# Patient Record
Sex: Male | Born: 1940 | Race: White | Hispanic: No | Marital: Single | Smoking: Never smoker
Health system: Southern US, Community
[De-identification: ages and names within clinical notes are randomized; demographics above are authoritative.]

## PROBLEM LIST (undated history)

## (undated) DIAGNOSIS — C20 Malignant neoplasm of rectum: Secondary | ICD-10-CM

## (undated) DIAGNOSIS — E119 Type 2 diabetes mellitus without complications: Secondary | ICD-10-CM

## (undated) DIAGNOSIS — I10 Essential (primary) hypertension: Secondary | ICD-10-CM

## (undated) HISTORY — DX: Malignant neoplasm of rectum: C20

## (undated) HISTORY — DX: Type 2 diabetes mellitus without complications: E11.9

## (undated) HISTORY — DX: Essential (primary) hypertension: I10

---

## 2005-12-18 ENCOUNTER — Other Ambulatory Visit: Payer: Self-pay

## 2005-12-18 ENCOUNTER — Emergency Department: Payer: Self-pay | Admitting: Emergency Medicine

## 2006-02-23 ENCOUNTER — Other Ambulatory Visit: Payer: Self-pay

## 2006-02-23 ENCOUNTER — Inpatient Hospital Stay: Payer: Self-pay | Admitting: Internal Medicine

## 2006-09-07 ENCOUNTER — Emergency Department: Payer: Self-pay | Admitting: Emergency Medicine

## 2006-09-09 ENCOUNTER — Emergency Department: Payer: Self-pay | Admitting: General Practice

## 2007-09-28 ENCOUNTER — Emergency Department: Payer: Self-pay | Admitting: Emergency Medicine

## 2007-11-22 ENCOUNTER — Emergency Department: Payer: Self-pay | Admitting: Emergency Medicine

## 2007-11-22 ENCOUNTER — Other Ambulatory Visit: Payer: Self-pay

## 2008-02-29 ENCOUNTER — Emergency Department: Payer: Self-pay | Admitting: Emergency Medicine

## 2008-07-18 ENCOUNTER — Emergency Department: Payer: Self-pay | Admitting: Emergency Medicine

## 2008-10-22 ENCOUNTER — Emergency Department: Payer: Self-pay | Admitting: Emergency Medicine

## 2009-01-24 ENCOUNTER — Emergency Department: Payer: Self-pay

## 2009-01-25 ENCOUNTER — Observation Stay: Payer: Self-pay | Admitting: Internal Medicine

## 2009-06-12 ENCOUNTER — Emergency Department: Payer: Self-pay | Admitting: Emergency Medicine

## 2009-12-23 ENCOUNTER — Emergency Department: Payer: Self-pay | Admitting: Unknown Physician Specialty

## 2010-04-23 ENCOUNTER — Observation Stay: Payer: Self-pay | Admitting: Internal Medicine

## 2011-09-03 ENCOUNTER — Emergency Department: Payer: Self-pay | Admitting: Emergency Medicine

## 2011-09-03 LAB — COMPREHENSIVE METABOLIC PANEL
BUN: 9 mg/dL (ref 7–18)
Bilirubin,Total: 0.3 mg/dL (ref 0.2–1.0)
Calcium, Total: 8.9 mg/dL (ref 8.5–10.1)
Osmolality: 281 (ref 275–301)
SGOT(AST): 18 U/L (ref 15–37)
SGPT (ALT): 19 U/L
Total Protein: 7.6 g/dL (ref 6.4–8.2)

## 2011-09-03 LAB — CBC
MCHC: 31.9 g/dL — ABNORMAL LOW (ref 32.0–36.0)
MCV: 87 fL (ref 80–100)
Platelet: 308 10*3/uL (ref 150–440)

## 2012-05-20 DIAGNOSIS — C20 Malignant neoplasm of rectum: Secondary | ICD-10-CM

## 2012-05-20 HISTORY — DX: Malignant neoplasm of rectum: C20

## 2012-06-11 LAB — CBC AND DIFFERENTIAL: Hemoglobin: 10.4 g/dL — AB (ref 13.5–17.5)

## 2012-06-18 LAB — HEPATIC FUNCTION PANEL: Alkaline Phosphatase: 77 U/L (ref 25–125)

## 2012-06-18 LAB — BASIC METABOLIC PANEL
BUN: 14 mg/dL (ref 4–21)
Glucose: 121 mg/dL
Potassium: 5.3 mmol/L (ref 3.4–5.3)
Sodium: 8 mmol/L — AB (ref 137–147)

## 2012-06-25 ENCOUNTER — Ambulatory Visit: Payer: Self-pay | Admitting: General Surgery

## 2012-06-27 ENCOUNTER — Encounter: Payer: Self-pay | Admitting: General Surgery

## 2012-06-27 DIAGNOSIS — E119 Type 2 diabetes mellitus without complications: Secondary | ICD-10-CM | POA: Insufficient documentation

## 2012-06-27 DIAGNOSIS — I10 Essential (primary) hypertension: Secondary | ICD-10-CM | POA: Insufficient documentation

## 2012-06-27 DIAGNOSIS — C20 Malignant neoplasm of rectum: Secondary | ICD-10-CM

## 2012-07-09 ENCOUNTER — Ambulatory Visit: Payer: Self-pay | Admitting: Radiation Oncology

## 2012-07-18 ENCOUNTER — Ambulatory Visit: Payer: Self-pay | Admitting: Radiation Oncology

## 2012-07-20 ENCOUNTER — Ambulatory Visit: Payer: Self-pay | Admitting: General Surgery

## 2012-07-23 ENCOUNTER — Ambulatory Visit: Payer: Self-pay | Admitting: General Surgery

## 2012-07-28 LAB — COMPREHENSIVE METABOLIC PANEL
Albumin: 3.1 g/dL — ABNORMAL LOW (ref 3.4–5.0)
Alkaline Phosphatase: 79 U/L (ref 50–136)
BUN: 15 mg/dL (ref 7–18)
Bilirubin,Total: 0.3 mg/dL (ref 0.2–1.0)
Calcium, Total: 8.7 mg/dL (ref 8.5–10.1)
Creatinine: 0.9 mg/dL (ref 0.60–1.30)
Glucose: 155 mg/dL — ABNORMAL HIGH (ref 65–99)
Osmolality: 280 (ref 275–301)
SGOT(AST): 15 U/L (ref 15–37)
SGPT (ALT): 23 U/L (ref 12–78)
Sodium: 138 mmol/L (ref 136–145)
Total Protein: 7.1 g/dL (ref 6.4–8.2)

## 2012-07-28 LAB — CBC CANCER CENTER
Basophil %: 0.8 %
Eosinophil #: 0.2 x10 3/mm (ref 0.0–0.7)
HGB: 10 g/dL — ABNORMAL LOW (ref 13.0–18.0)
Lymphocyte #: 1.6 x10 3/mm (ref 1.0–3.6)
Lymphocyte %: 26.5 %
MCHC: 31.7 g/dL — ABNORMAL LOW (ref 32.0–36.0)
MCV: 76 fL — ABNORMAL LOW (ref 80–100)
Monocyte #: 0.4 x10 3/mm (ref 0.2–1.0)
Monocyte %: 6.9 %
Neutrophil #: 3.8 x10 3/mm (ref 1.4–6.5)
RBC: 4.16 10*6/uL — ABNORMAL LOW (ref 4.40–5.90)

## 2012-08-04 LAB — CBC CANCER CENTER
Basophil #: 0 x10 3/mm (ref 0.0–0.1)
Eosinophil #: 0.3 x10 3/mm (ref 0.0–0.7)
HCT: 32.1 % — ABNORMAL LOW (ref 40.0–52.0)
HGB: 10.3 g/dL — ABNORMAL LOW (ref 13.0–18.0)
Lymphocyte #: 1 x10 3/mm (ref 1.0–3.6)
MCH: 24.6 pg — ABNORMAL LOW (ref 26.0–34.0)
MCV: 76 fL — ABNORMAL LOW (ref 80–100)
Monocyte #: 0.2 x10 3/mm (ref 0.2–1.0)
Monocyte %: 6.4 %
Neutrophil #: 2.3 x10 3/mm (ref 1.4–6.5)
RBC: 4.2 10*6/uL — ABNORMAL LOW (ref 4.40–5.90)

## 2012-08-18 ENCOUNTER — Ambulatory Visit: Payer: Self-pay | Admitting: Radiation Oncology

## 2012-08-18 LAB — CBC CANCER CENTER
Eosinophil #: 0.4 x10 3/mm (ref 0.0–0.7)
HCT: 30 % — ABNORMAL LOW (ref 40.0–52.0)
HGB: 9.5 g/dL — ABNORMAL LOW (ref 13.0–18.0)
Lymphocyte #: 0.6 x10 3/mm — ABNORMAL LOW (ref 1.0–3.6)
MCH: 24.5 pg — ABNORMAL LOW (ref 26.0–34.0)
Monocyte %: 13.2 %
Neutrophil #: 1.7 x10 3/mm (ref 1.4–6.5)
Neutrophil %: 56.2 %
Platelet: 213 x10 3/mm (ref 150–440)
RBC: 3.88 10*6/uL — ABNORMAL LOW (ref 4.40–5.90)
RDW: 18.8 % — ABNORMAL HIGH (ref 11.5–14.5)
WBC: 3.1 x10 3/mm — ABNORMAL LOW (ref 3.8–10.6)

## 2012-09-17 ENCOUNTER — Ambulatory Visit: Payer: Self-pay | Admitting: Radiation Oncology

## 2012-09-17 ENCOUNTER — Ambulatory Visit: Payer: Self-pay | Admitting: Oncology

## 2012-10-15 ENCOUNTER — Ambulatory Visit: Payer: Medicare Other | Admitting: General Surgery

## 2012-10-18 ENCOUNTER — Ambulatory Visit: Payer: Self-pay | Admitting: Radiation Oncology

## 2012-10-19 ENCOUNTER — Ambulatory Visit (INDEPENDENT_AMBULATORY_CARE_PROVIDER_SITE_OTHER): Payer: Medicare Other | Admitting: General Surgery

## 2012-10-19 ENCOUNTER — Encounter: Payer: Self-pay | Admitting: General Surgery

## 2012-10-19 VITALS — BP 140/78 | HR 84 | Resp 16 | Ht 64.0 in | Wt 192.0 lb

## 2012-10-19 DIAGNOSIS — I8390 Asymptomatic varicose veins of unspecified lower extremity: Secondary | ICD-10-CM

## 2012-10-19 DIAGNOSIS — C20 Malignant neoplasm of rectum: Secondary | ICD-10-CM

## 2012-10-19 DIAGNOSIS — I8391 Asymptomatic varicose veins of right lower extremity: Secondary | ICD-10-CM

## 2012-10-19 NOTE — Progress Notes (Addendum)
Patient ID: John Shaw, male   DOB: April 15, 1941, 72 y.o.   MRN: [id number]  Chief Complaint  Patient presents with  . Follow-up    evaluation rectal cancer    HPI John Shaw is a 72 y.o. male who presents for a follow up evaluation for rectal cancer. The patient was initially evaluated in January/February 2014. He was felt to have advanced cancer based on CT imaging showed a large perirectal mass and an elevated CEA. He has completed neoadjuvant chemotherapy. The patient is seen now accompanied by his cousin to discuss options and indications for surgery. HPI  Past Medical History  Diagnosis Date  . Diabetes mellitus without complication   . Hypertension   . Rectal cancer 2014    Past Surgical History  Procedure Laterality Date  . No past surgeries      No family history on file.  Social History History  Substance Use Topics  . Smoking status: Never Smoker   . Smokeless tobacco: Not on file  . Alcohol Use: No    No Known Allergies  Current Outpatient Prescriptions  Medication Sig Dispense Refill  . amLODipine (NORVASC) 10 MG tablet Take 5 mg by mouth daily.       Marland Kitchen atenolol (TENORMIN) 25 MG tablet Take 25 mg by mouth daily.      . hydrocortisone (ANUSOL-HC) 25 MG suppository Place 25 mg rectally as needed.       Marland Kitchen lisinopril (PRINIVIL,ZESTRIL) 5 MG tablet Take 5 mg by mouth daily.      . metFORMIN (GLUMETZA) 500 MG (MOD) 24 hr tablet Take 1,000 mg by mouth daily.       . ondansetron (ZOFRAN) 4 MG tablet Take 4 mg by mouth every 6 (six) hours as needed. Nausea/vomiting      . pramoxine-hydrocortisone 1-1 % foam Apply 1 application topically 3 (three) times daily as needed (hemmorroid pain).       . saw palmetto 500 MG capsule Take 500 mg by mouth daily.       No current facility-administered medications for this visit.    Review of Systems Review of Systems  Constitutional: Negative.   Respiratory: Negative.   Cardiovascular: Negative.    Gastrointestinal: Negative.     Blood pressure 140/78, pulse 84, resp. rate 16, height 5\' 4"  (1.626 m), weight 192 lb (87.091 kg).  Physical Exam Physical Exam  Constitutional: He appears well-developed and well-nourished.  Neck: Trachea normal. No mass and no thyromegaly present.  Cardiovascular: Normal rate, regular rhythm, normal heart sounds and normal pulses.   No murmur heard. Pulmonary/Chest: Effort normal and breath sounds normal.  Lymphadenopathy:       Right cervical: No posterior cervical adenopathy present.      Left cervical: No posterior cervical adenopathy present.       Right: No inguinal adenopathy present.       Left: No inguinal adenopathy present.  No supraclavicular adenopathy is appreciated.  The right lower extremity shows significant superficial varicosities. There is mild edema on the right leg without tenderness. No evidence of discoloration or ulceration.  Location 10 cm below the tibial tubercle the right calf measures 41 cm in circumference, the left measures 39.5 cm.  Data Reviewed No significant neutropenia developed during his treatment..  The patient's weight is up 9 pounds from before chemotherapy/radiation.  Anoscopy: An approximately 5 cm some residual tissue is present on the left anterior lateral wall as well as superior to this. It was slightly fragile to  touch, but stool Hemoccult was negative. This area is difficult to appreciate on palpation.  Assessment    Rectal cancer, within 5 cm of the anal verge.  Moderate right lower extremity superficial venous for prostate, mild edema, asymptomatic. Essential hypertension Diabetes, non-insulin-dependent.    Plan      This is a low rectal lesion, and he may not be a candidate for sparing of the sphincter. I encouraged him to consider University evaluation for possible low, low anterior resection with the pouch. This lesion may be still too low for this procedure, but I think formal  evaluation by the colorectal service is appropriate. If he is to have surgery locally, I would find it difficult to do anything short of an abdominal perineal resection.  If the patient is not found to be a candidate for re\re anastomosis, he is welcome to return and have his AP resection completed here.  The patient and his cousin were amenable to university evaluation. Arrangements were made for assessment by Riley Nearing, M.D., at the [facility] Department of surgery.        John Shaw 10/19/2012, 5:02 PM

## 2012-10-19 NOTE — Patient Instructions (Addendum)
Patient is advised to go at [facility] for a second option regarding rectal surgery.

## 2012-10-27 ENCOUNTER — Ambulatory Visit: Payer: Self-pay | Admitting: General Surgery

## 2012-11-17 ENCOUNTER — Ambulatory Visit: Payer: Self-pay | Admitting: Radiation Oncology

## 2012-12-10 LAB — COMPREHENSIVE METABOLIC PANEL
Albumin: 3.2 g/dL — ABNORMAL LOW (ref 3.4–5.0)
Alkaline Phosphatase: 168 U/L — ABNORMAL HIGH (ref 50–136)
Anion Gap: 10 (ref 7–16)
Bilirubin,Total: 0.5 mg/dL (ref 0.2–1.0)
Chloride: 100 mmol/L (ref 98–107)
Co2: 22 mmol/L (ref 21–32)
EGFR (African American): >60
SGPT (ALT): 33 U/L (ref 12–78)
Sodium: 132 mmol/L — ABNORMAL LOW (ref 136–145)

## 2012-12-10 LAB — CBC CANCER CENTER
Eosinophil #: 0.7 x10 3/mm (ref 0.0–0.7)
Eosinophil %: 5 %
HCT: 33 % — ABNORMAL LOW (ref 40.0–52.0)
HGB: 11.3 g/dL — ABNORMAL LOW (ref 13.0–18.0)
MCHC: 34.2 g/dL (ref 32.0–36.0)
MCV: 83 fL (ref 80–100)
Platelet: 518 x10 3/mm — ABNORMAL HIGH (ref 150–440)
RBC: 3.96 10*6/uL — ABNORMAL LOW (ref 4.40–5.90)

## 2012-12-18 ENCOUNTER — Ambulatory Visit: Payer: Self-pay | Admitting: Radiation Oncology

## 2012-12-22 LAB — CBC WITH DIFFERENTIAL/PLATELET
Basophil #: 0 10*3/uL (ref 0.0–0.1)
HCT: 34.1 % — ABNORMAL LOW (ref 40.0–52.0)
HGB: 11.6 g/dL — ABNORMAL LOW (ref 13.0–18.0)
Lymphocyte %: 8.9 %
Monocyte %: 10.8 %
RBC: 4.1 10*6/uL — ABNORMAL LOW (ref 4.40–5.90)
RDW: 17.3 % — ABNORMAL HIGH (ref 11.5–14.5)
WBC: 14.1 10*3/uL — ABNORMAL HIGH (ref 3.8–10.6)

## 2012-12-22 LAB — TSH: Thyroid Stimulating Horm: 2.26 u[IU]/mL

## 2012-12-22 LAB — URINALYSIS, COMPLETE
Bacteria: NONE SEEN
Blood: NEGATIVE
Glucose,UR: NEGATIVE mg/dL (ref 0–75)
Leukocyte Esterase: NEGATIVE
Ph: 5 (ref 4.5–8.0)
Squamous Epithelial: NONE SEEN
WBC UR: 5 /HPF (ref 0–5)

## 2012-12-22 LAB — COMPREHENSIVE METABOLIC PANEL
Albumin: 2.9 g/dL — ABNORMAL LOW (ref 3.4–5.0)
BUN: 64 mg/dL — ABNORMAL HIGH (ref 7–18)
Co2: 17 mmol/L — ABNORMAL LOW (ref 21–32)
EGFR (African American): 15 — ABNORMAL LOW
EGFR (Non-African Amer.): 13 — ABNORMAL LOW
Glucose: 120 mg/dL — ABNORMAL HIGH (ref 65–99)
Potassium: 5.4 mmol/L — ABNORMAL HIGH (ref 3.5–5.1)
SGOT(AST): 18 U/L (ref 15–37)
SGPT (ALT): 27 U/L (ref 12–78)
Sodium: 128 mmol/L — ABNORMAL LOW (ref 136–145)
Total Protein: 8.3 g/dL — ABNORMAL HIGH (ref 6.4–8.2)

## 2012-12-23 ENCOUNTER — Inpatient Hospital Stay: Payer: Self-pay | Admitting: Internal Medicine

## 2012-12-23 LAB — BASIC METABOLIC PANEL
Anion Gap: 7 (ref 7–16)
BUN: 54 mg/dL — ABNORMAL HIGH (ref 7–18)
Calcium, Total: 9.2 mg/dL (ref 8.5–10.1)
Chloride: 109 mmol/L — ABNORMAL HIGH (ref 98–107)
Creatinine: 3.01 mg/dL — ABNORMAL HIGH (ref 0.60–1.30)
EGFR (African American): 23 — ABNORMAL LOW
Glucose: 131 mg/dL — ABNORMAL HIGH (ref 65–99)
Osmolality: 289 (ref 275–301)
Potassium: 4.8 mmol/L (ref 3.5–5.1)
Sodium: 136 mmol/L (ref 136–145)

## 2012-12-24 LAB — CBC WITH DIFFERENTIAL/PLATELET
Basophil #: 0 10*3/uL (ref 0.0–0.1)
Basophil %: 0.4 %
Eosinophil #: 0.2 10*3/uL (ref 0.0–0.7)
Eosinophil %: 2.3 %
HCT: 25.3 % — ABNORMAL LOW (ref 40.0–52.0)
Lymphocyte #: 0.7 10*3/uL — ABNORMAL LOW (ref 1.0–3.6)
Lymphocyte %: 6.9 %
MCH: 28.8 pg (ref 26.0–34.0)
MCHC: 35.1 g/dL (ref 32.0–36.0)
Monocyte #: 0.9 x10 3/mm (ref 0.2–1.0)
Monocyte %: 9.6 %
Neutrophil #: 7.6 10*3/uL — ABNORMAL HIGH (ref 1.4–6.5)
Platelet: 176 10*3/uL (ref 150–440)

## 2012-12-24 LAB — BASIC METABOLIC PANEL
Anion Gap: 6 — ABNORMAL LOW (ref 7–16)
Calcium, Total: 8.3 mg/dL — ABNORMAL LOW (ref 8.5–10.1)
Co2: 19 mmol/L — ABNORMAL LOW (ref 21–32)
Osmolality: 274 (ref 275–301)

## 2012-12-25 ENCOUNTER — Emergency Department: Payer: Self-pay | Admitting: Emergency Medicine

## 2012-12-25 LAB — COMPREHENSIVE METABOLIC PANEL
Albumin: 2.3 g/dL — ABNORMAL LOW (ref 3.4–5.0)
Alkaline Phosphatase: 135 U/L (ref 50–136)
Anion Gap: 8 (ref 7–16)
Bilirubin,Total: 0.4 mg/dL (ref 0.2–1.0)
Calcium, Total: 8.8 mg/dL (ref 8.5–10.1)
Co2: 20 mmol/L — ABNORMAL LOW (ref 21–32)
Creatinine: 1.03 mg/dL (ref 0.60–1.30)
EGFR (Non-African Amer.): >60
Glucose: 113 mg/dL — ABNORMAL HIGH (ref 65–99)
Osmolality: 268 (ref 275–301)
Potassium: 4.7 mmol/L (ref 3.5–5.1)
SGOT(AST): 20 U/L (ref 15–37)

## 2012-12-25 LAB — CBC
HGB: 9.8 g/dL — ABNORMAL LOW (ref 13.0–18.0)
MCH: 27.9 pg (ref 26.0–34.0)
MCHC: 34 g/dL (ref 32.0–36.0)

## 2012-12-25 LAB — PROTIME-INR
INR: 1.2
Prothrombin Time: 15 secs — ABNORMAL HIGH (ref 11.5–14.7)

## 2012-12-25 LAB — URINALYSIS, COMPLETE
Bilirubin,UR: NEGATIVE
Blood: NEGATIVE
Glucose,UR: NEGATIVE mg/dL (ref 0–75)
Ketone: NEGATIVE
Ph: 5 (ref 4.5–8.0)
RBC,UR: <1 /HPF (ref 0–5)
Specific Gravity: 1.008 (ref 1.003–1.030)
Squamous Epithelial: <1
WBC UR: <1 /HPF (ref 0–5)

## 2012-12-25 LAB — TROPONIN I: Troponin-I: <0.02 ng/mL

## 2012-12-25 LAB — CK TOTAL AND CKMB (NOT AT ARMC)
CK, Total: 55 U/L (ref 35–232)
CK-MB: 0.6 ng/mL (ref 0.5–3.6)

## 2012-12-26 ENCOUNTER — Emergency Department: Payer: Self-pay | Admitting: Emergency Medicine

## 2012-12-27 ENCOUNTER — Emergency Department: Payer: Self-pay | Admitting: Emergency Medicine

## 2012-12-27 LAB — CBC WITH DIFFERENTIAL/PLATELET
Basophil %: 0.5 %
HGB: 10.3 g/dL — ABNORMAL LOW (ref 13.0–18.0)
Lymphocyte #: 0.9 10*3/uL — ABNORMAL LOW (ref 1.0–3.6)
Lymphocyte %: 9.9 %
MCHC: 34 g/dL (ref 32.0–36.0)
MCV: 82 fL (ref 80–100)
Monocyte #: 0.7 x10 3/mm (ref 0.2–1.0)
Neutrophil #: 6.6 10*3/uL — ABNORMAL HIGH (ref 1.4–6.5)
Neutrophil %: 76.7 %
Platelet: 242 10*3/uL (ref 150–440)
RDW: 16.7 % — ABNORMAL HIGH (ref 11.5–14.5)
WBC: 8.7 10*3/uL (ref 3.8–10.6)

## 2012-12-27 LAB — COMPREHENSIVE METABOLIC PANEL
Albumin: 2.4 g/dL — ABNORMAL LOW (ref 3.4–5.0)
Alkaline Phosphatase: 166 U/L — ABNORMAL HIGH (ref 50–136)
Anion Gap: 9 (ref 7–16)
BUN: 12 mg/dL (ref 7–18)
Calcium, Total: 9.2 mg/dL (ref 8.5–10.1)
EGFR (African American): >60
EGFR (Non-African Amer.): >60
Glucose: 106 mg/dL — ABNORMAL HIGH (ref 65–99)
Potassium: 4.2 mmol/L (ref 3.5–5.1)
SGOT(AST): 20 U/L (ref 15–37)

## 2012-12-27 LAB — TROPONIN I: Troponin-I: <0.02 ng/mL

## 2012-12-28 ENCOUNTER — Ambulatory Visit: Payer: Self-pay | Admitting: Radiation Oncology

## 2013-01-10 ENCOUNTER — Emergency Department: Payer: Self-pay | Admitting: Emergency Medicine

## 2013-01-11 LAB — URINALYSIS, COMPLETE
Bacteria: NONE SEEN
Bilirubin,UR: NEGATIVE
Blood: NEGATIVE
Ketone: NEGATIVE
Leukocyte Esterase: NEGATIVE
Nitrite: NEGATIVE
Protein: NEGATIVE
RBC,UR: 5 /HPF (ref 0–5)
WBC UR: 3 /HPF (ref 0–5)

## 2013-01-11 LAB — COMPREHENSIVE METABOLIC PANEL
Albumin: 2.9 g/dL — ABNORMAL LOW (ref 3.4–5.0)
Alkaline Phosphatase: 95 U/L (ref 50–136)
Bilirubin,Total: 0.4 mg/dL (ref 0.2–1.0)
Co2: 21 mmol/L (ref 21–32)
EGFR (Non-African Amer.): >60
Glucose: 141 mg/dL — ABNORMAL HIGH (ref 65–99)
Potassium: 3.9 mmol/L (ref 3.5–5.1)
SGOT(AST): 14 U/L — ABNORMAL LOW (ref 15–37)
Sodium: 138 mmol/L (ref 136–145)
Total Protein: 7.2 g/dL (ref 6.4–8.2)

## 2013-01-11 LAB — CBC
HCT: 33.3 % — ABNORMAL LOW (ref 40.0–52.0)
HGB: 11.3 g/dL — ABNORMAL LOW (ref 13.0–18.0)
MCH: 28.6 pg (ref 26.0–34.0)
MCHC: 33.8 g/dL (ref 32.0–36.0)
Platelet: 259 10*3/uL (ref 150–440)
RDW: 17.5 % — ABNORMAL HIGH (ref 11.5–14.5)
WBC: 8.2 10*3/uL (ref 3.8–10.6)

## 2013-01-16 LAB — CULTURE, BLOOD (SINGLE)

## 2013-02-02 ENCOUNTER — Inpatient Hospital Stay: Payer: Self-pay | Admitting: Internal Medicine

## 2013-02-02 LAB — COMPREHENSIVE METABOLIC PANEL
Alkaline Phosphatase: 161 U/L — ABNORMAL HIGH (ref 50–136)
Anion Gap: 11 (ref 7–16)
Bilirubin,Total: 0.6 mg/dL (ref 0.2–1.0)
Calcium, Total: 9.8 mg/dL (ref 8.5–10.1)
Chloride: 107 mmol/L (ref 98–107)
Co2: 15 mmol/L — ABNORMAL LOW (ref 21–32)
Creatinine: 2.47 mg/dL — ABNORMAL HIGH (ref 0.60–1.30)
EGFR (Non-African Amer.): 25 — ABNORMAL LOW
Potassium: 4.4 mmol/L (ref 3.5–5.1)
SGOT(AST): 25 U/L (ref 15–37)
SGPT (ALT): 39 U/L (ref 12–78)
Sodium: 133 mmol/L — ABNORMAL LOW (ref 136–145)

## 2013-02-02 LAB — CBC
HCT: 35.7 % — ABNORMAL LOW (ref 40.0–52.0)
HGB: 12.1 g/dL — ABNORMAL LOW (ref 13.0–18.0)
MCH: 29 pg (ref 26.0–34.0)
MCHC: 33.8 g/dL (ref 32.0–36.0)
MCV: 86 fL (ref 80–100)
Platelet: 280 10*3/uL (ref 150–440)
RBC: 4.16 10*6/uL — ABNORMAL LOW (ref 4.40–5.90)
RDW: 17.6 % — ABNORMAL HIGH (ref 11.5–14.5)

## 2013-02-02 LAB — URINALYSIS, COMPLETE
Blood: NEGATIVE
Glucose,UR: NEGATIVE mg/dL (ref 0–75)
RBC,UR: <1 /HPF (ref 0–5)
WBC UR: 4 /HPF (ref 0–5)

## 2013-02-02 LAB — CK TOTAL AND CKMB (NOT AT ARMC)
CK-MB: 0.8 ng/mL (ref 0.5–3.6)
CK-MB: 0.8 ng/mL (ref 0.5–3.6)

## 2013-02-02 LAB — TROPONIN I
Troponin-I: <0.02 ng/mL
Troponin-I: <0.02 ng/mL

## 2013-02-03 DIAGNOSIS — I359 Nonrheumatic aortic valve disorder, unspecified: Secondary | ICD-10-CM

## 2013-02-03 LAB — BASIC METABOLIC PANEL
Anion Gap: 5 — ABNORMAL LOW (ref 7–16)
Calcium, Total: 9 mg/dL (ref 8.5–10.1)
Chloride: 112 mmol/L — ABNORMAL HIGH (ref 98–107)
Creatinine: 1.23 mg/dL (ref 0.60–1.30)
EGFR (African American): >60
Glucose: 127 mg/dL — ABNORMAL HIGH (ref 65–99)
Osmolality: 281 (ref 275–301)
Potassium: 4.6 mmol/L (ref 3.5–5.1)

## 2013-02-03 LAB — CBC WITH DIFFERENTIAL/PLATELET
Basophil #: 0 10*3/uL (ref 0.0–0.1)
Basophil %: 0.5 %
Eosinophil #: 0.2 10*3/uL (ref 0.0–0.7)
Eosinophil %: 3.2 %
HCT: 28.6 % — ABNORMAL LOW (ref 40.0–52.0)
Lymphocyte #: 0.6 10*3/uL — ABNORMAL LOW (ref 1.0–3.6)
Lymphocyte %: 8.2 %
MCH: 29.5 pg (ref 26.0–34.0)
MCV: 85 fL (ref 80–100)
Monocyte #: 0.4 x10 3/mm (ref 0.2–1.0)
Neutrophil #: 5.7 10*3/uL (ref 1.4–6.5)
RBC: 3.36 10*6/uL — ABNORMAL LOW (ref 4.40–5.90)

## 2013-02-12 ENCOUNTER — Ambulatory Visit: Payer: Self-pay | Admitting: Oncology

## 2013-06-14 IMAGING — CR DG CHEST 1V PORT
1 series · 1 of 1 positions shown · non-contrast
Comparison: none

REASON FOR EXAM: Power port placement. ERECT. Patient in Bed 7
COMMENTS:

[ap]
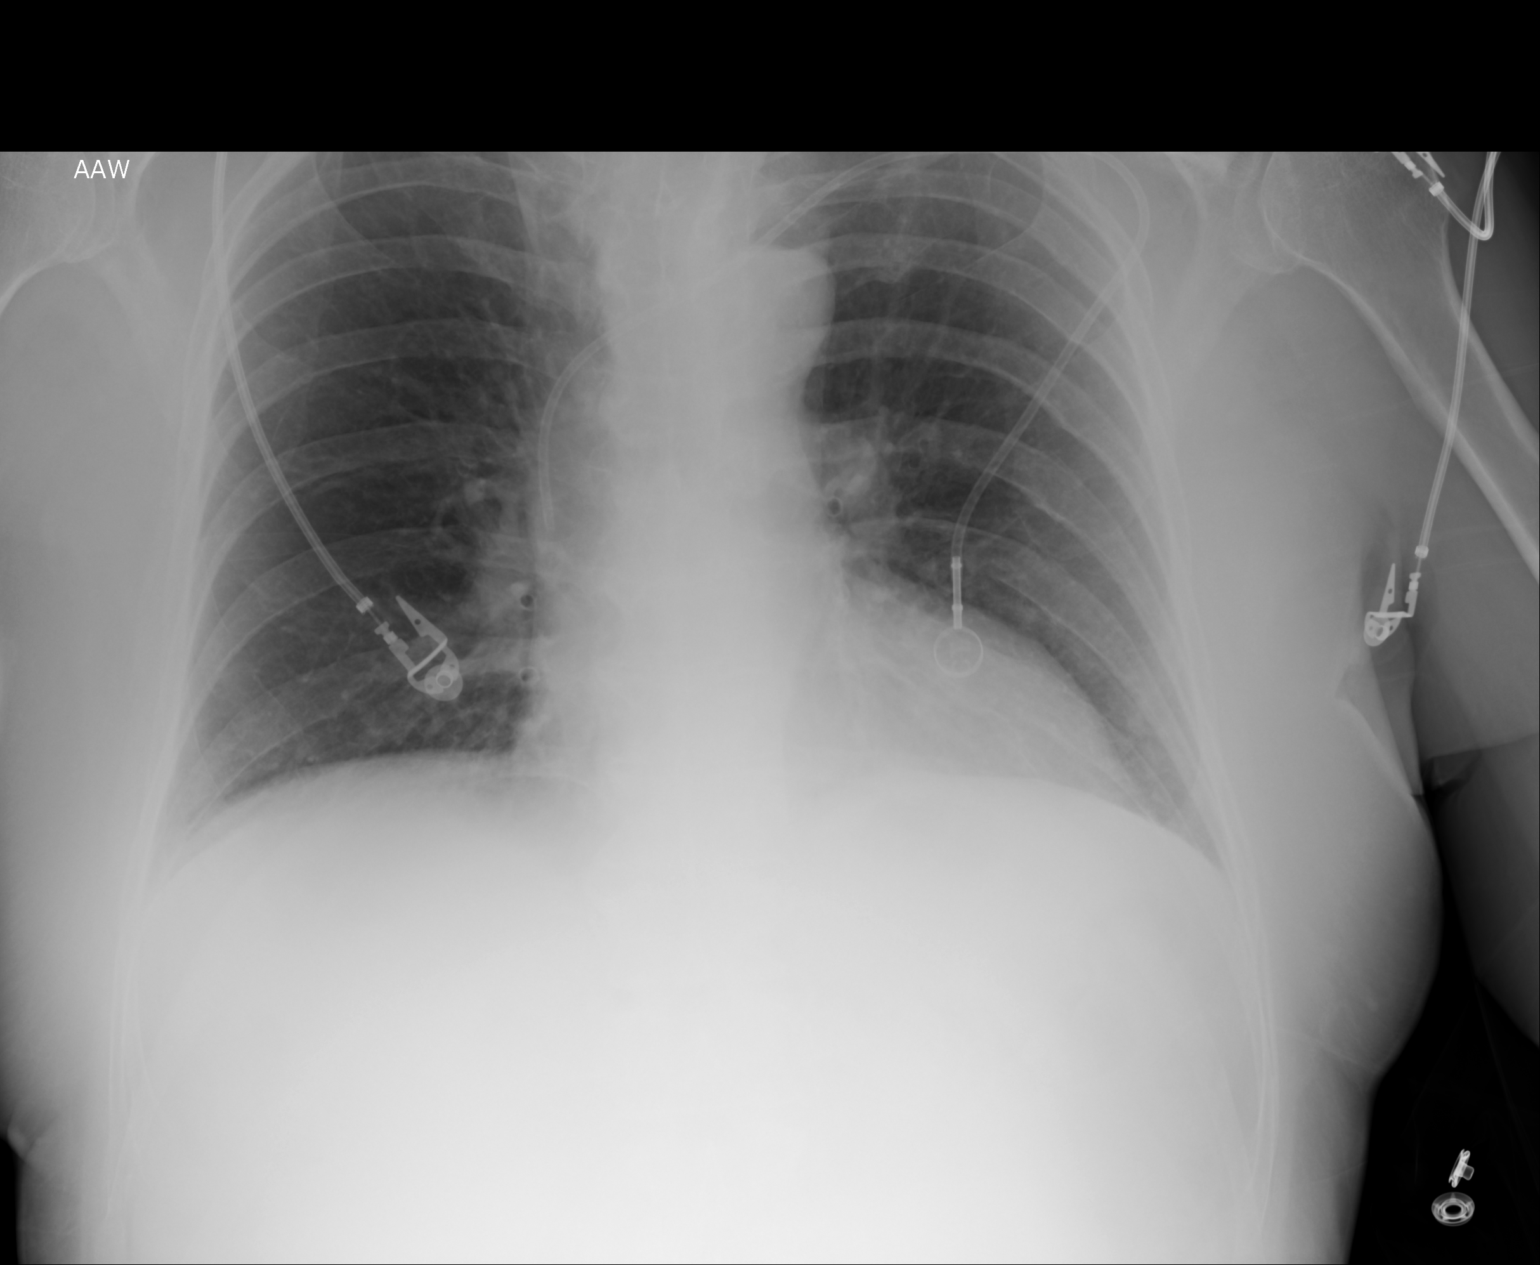

[1 of 1 positions shown; findings below may reference images not displayed]

PROCEDURE:     DXR - DXR PORTABLE CHEST SINGLE VIEW  - July 23, 2012  [DATE]

RESULT:     Comparison is made to the study dated 25 January, 2009. There is
a left-sided Port-A-Cath device which has been placed. The tip of the
catheter is in the superior vena cava. The lungs are clear and fully
inflated. There is no abnormal pleural fluid collection evident.
IMPRESSION: Port-A-Cath device present as described. No evidence of
complication.

[REDACTED]

## 2013-09-07 DIAGNOSIS — E119 Type 2 diabetes mellitus without complications: Secondary | ICD-10-CM | POA: Insufficient documentation

## 2013-12-25 IMAGING — US US CAROTID DUPLEX BILAT
1 series · 14 of 24 positions shown · non-contrast
Comparison: none

REASON FOR EXAM: syncope
COMMENTS:

[Series 1: us carotid duplex bilat · 0.07mm/px · 14 of 74 slices shown]
[im 1/74]
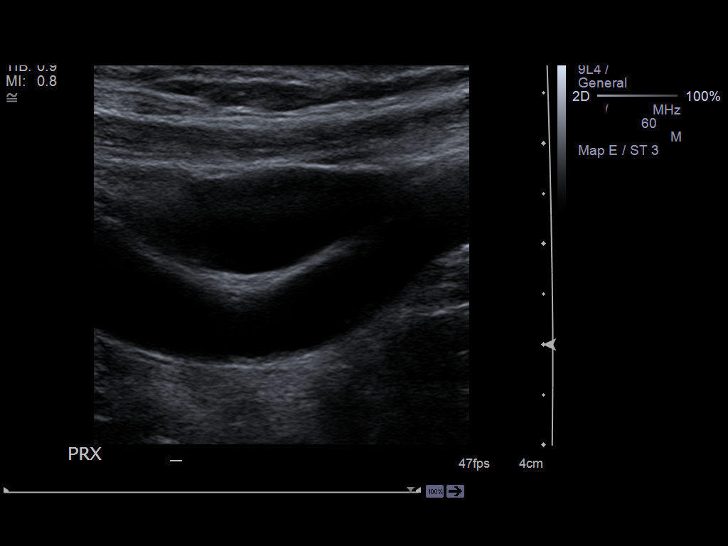
[im 7/74]
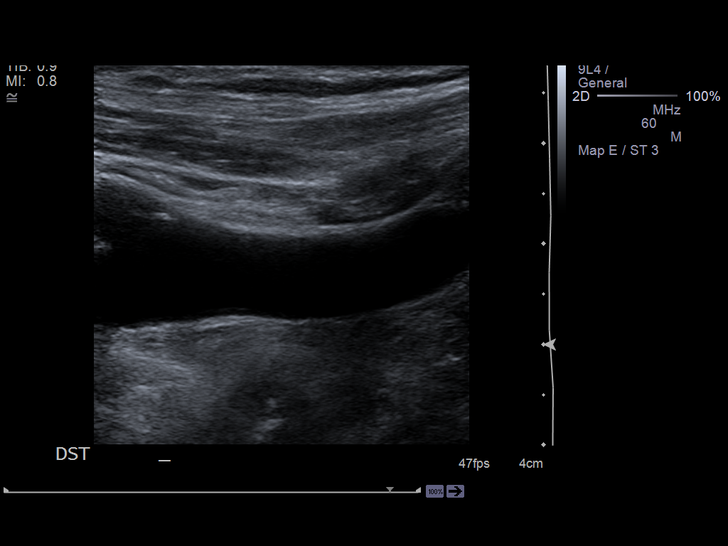
[im 13/74]
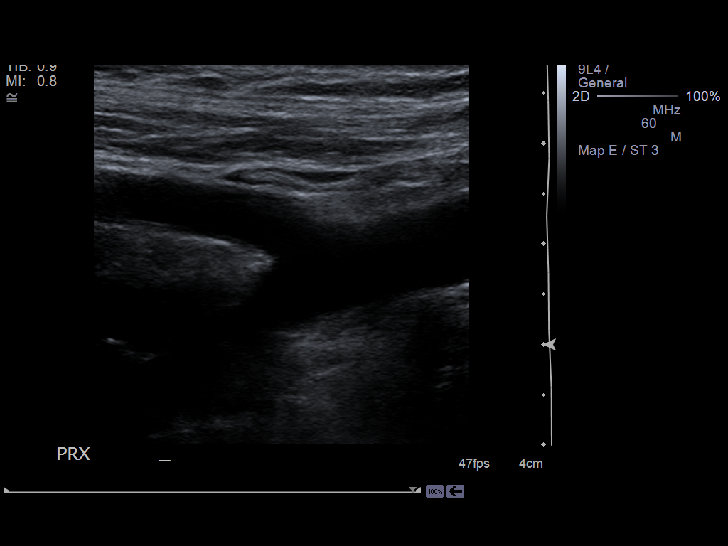
[im 20/74]
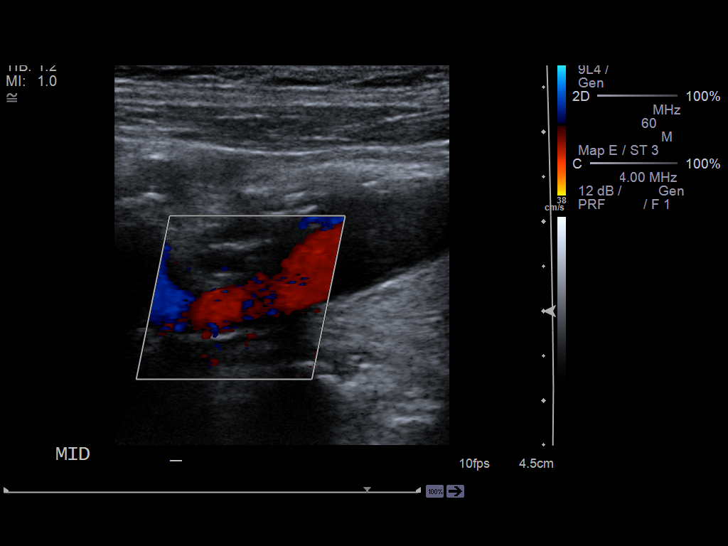
[im 23/74]
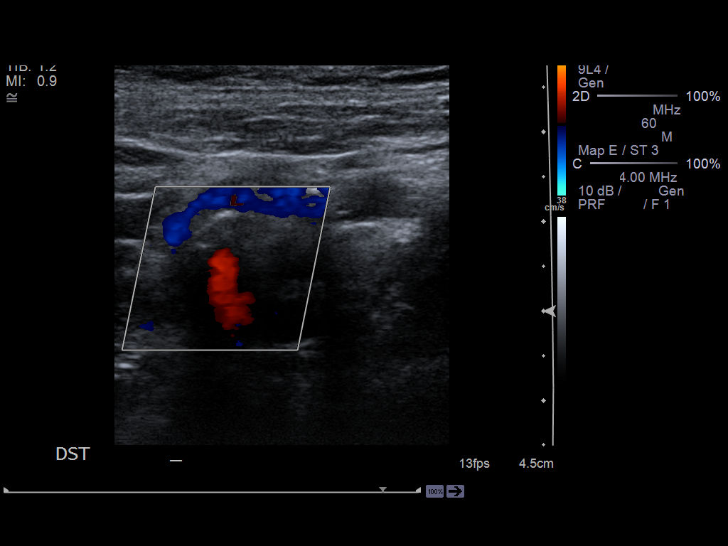
[im 29/74]
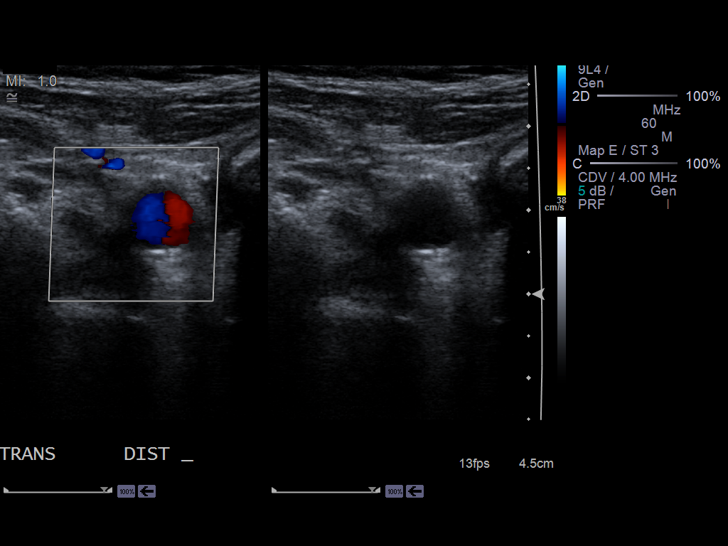
[im 35/74]
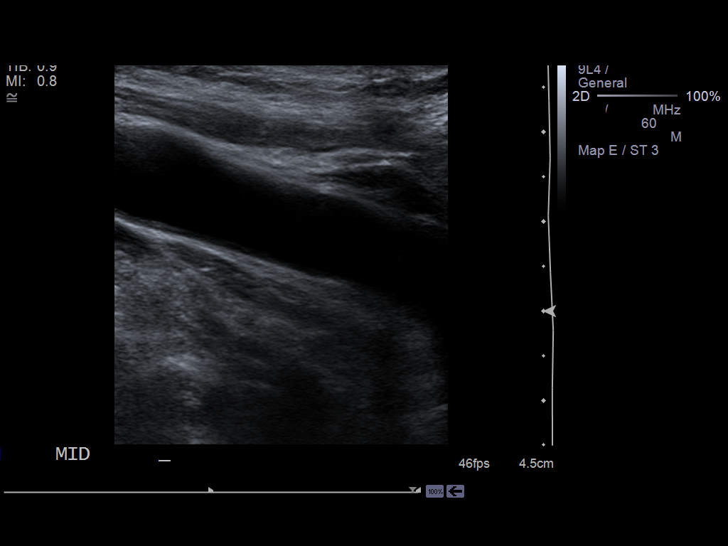
[im 39/74]
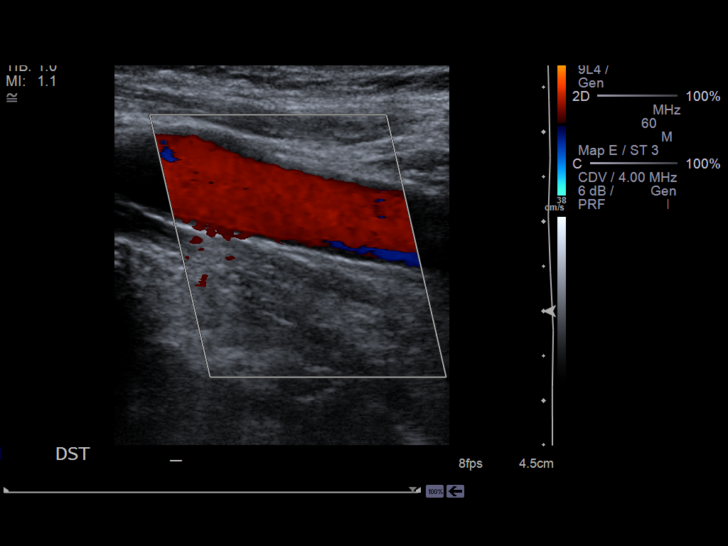
[im 45/74]
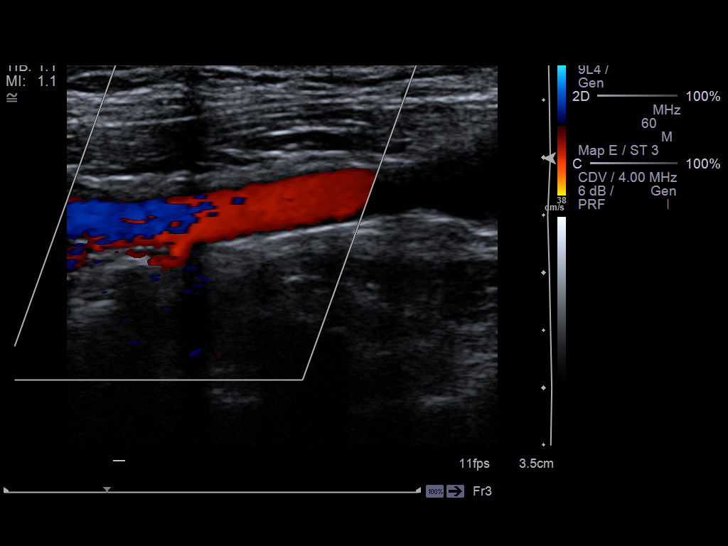
[im 51/74]
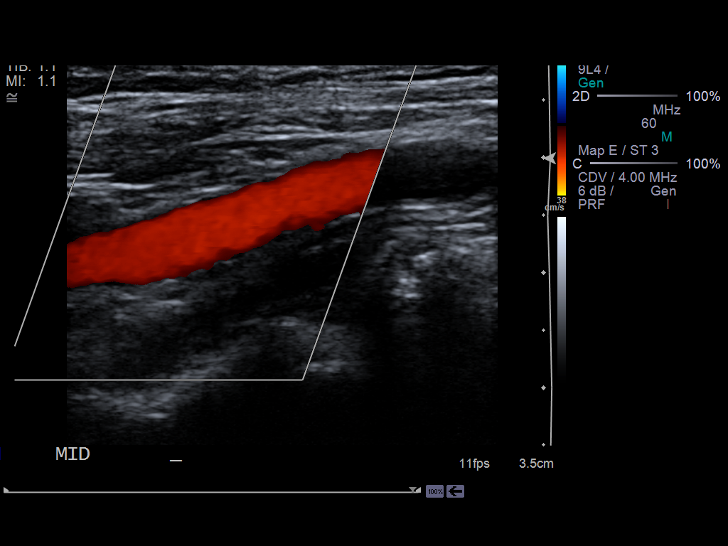
[im 58/74]
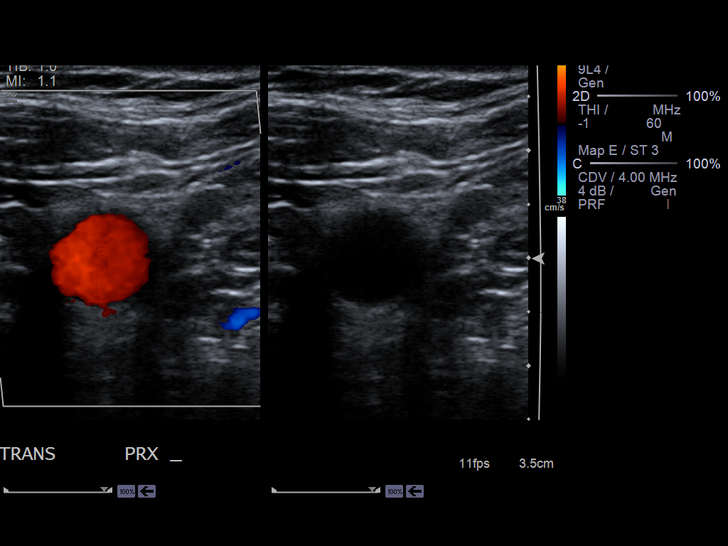
[im 61/74]
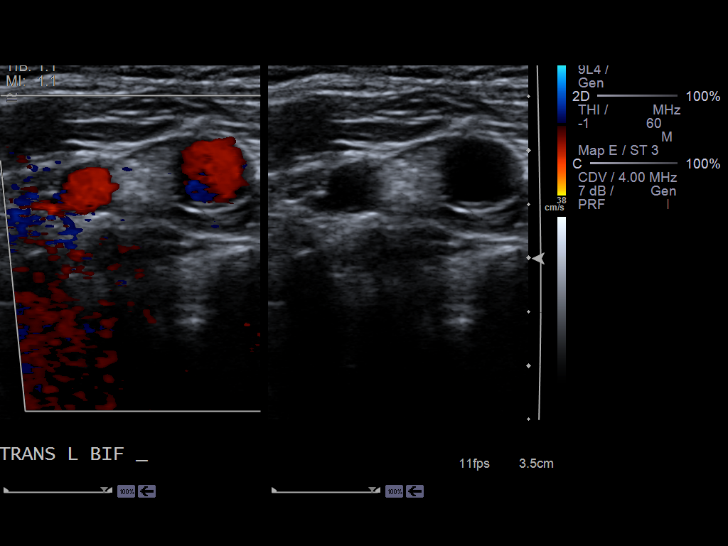
[im 67/74]
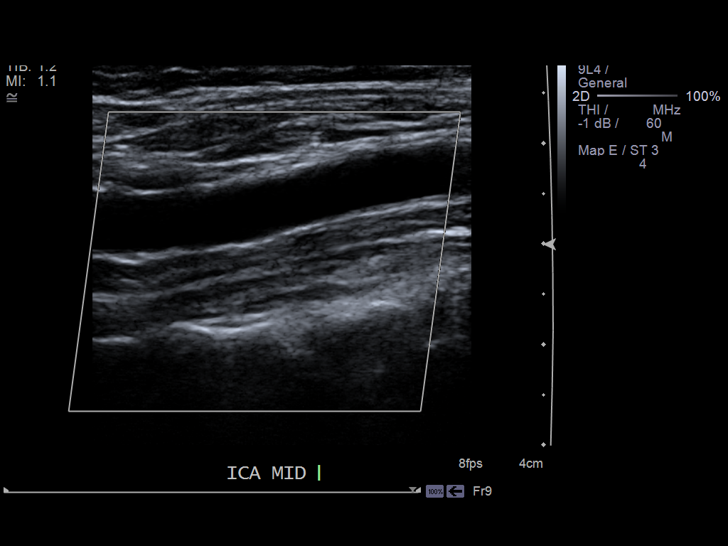
[im 74/74]
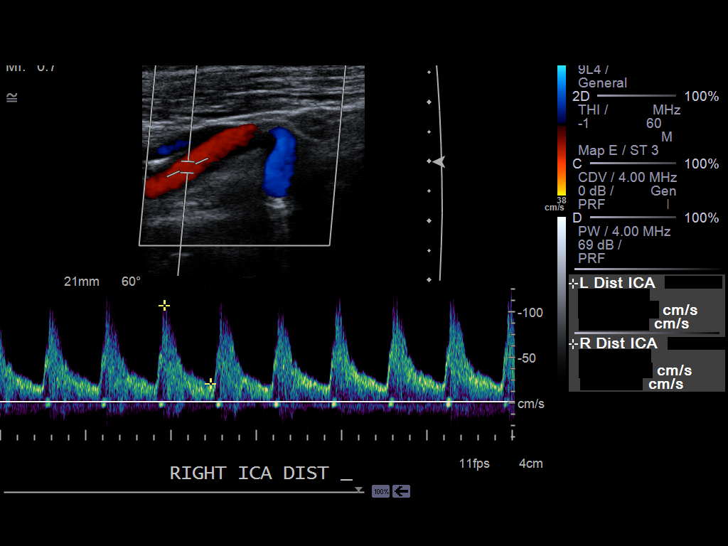

[14 of 24 positions shown; findings below may reference images not displayed]

PROCEDURE:     US  - US CAROTID DOPPLER BILATERAL  - February 02, 2013  [DATE]

RESULT:     Carotid Doppler interrogation is utilized bilaterally. Color
Doppler and spectral Doppler appearance shows no significant plaque
formation. There is minimal calcification in the carotid bifurcation region
bilaterally without ulceration. The color Doppler shows complete filling
otherwise. The spectral Doppler appearance shows normal carotid Doppler
waveforms. The peak systolic velocities are normal bilaterally in the
carotid systems. The internal to common carotid peak systolic velocity ratio
is 1.02 on the right and 0.97 normal left.
IMPRESSION: 1. Minimal atherosclerotic calcification. No evidence of hemodynamically
significant carotid stenosis.
2. Antegrade flow seen in both vertebral arteries without evidence of flow
reversal.

[REDACTED]

## 2014-05-20 HISTORY — PX: ROBOT ASSISTED LAPAROSCOPIC PARTIAL COLECTOMY: SHX6476

## 2014-05-31 HISTORY — PX: COLONOSCOPY: SHX174

## 2014-09-09 NOTE — Op Note (Signed)
PATIENT NAME:  John Shaw, STICKLES MR#:  [id number] DATE OF BIRTH:  [birth year]  DATE OF PROCEDURE:  07/23/2012  PREOPERATIVE DIAGNOSIS:  Rectal cancer, need for central venous access.   POSTOPERATIVE DIAGNOSIS:  Rectal cancer, need for central venous access.   OPERATIVE PROCEDURE:  Left subclavian power port placement.   OPERATING SURGEON:  Robert Bellow, MD  ANESTHESIA:  Attended local, 15 mL 1% plain Xylocaine.   ESTIMATED BLOOD LOSS:  Minimal.   CLINICAL NOTE:  This gentleman has presented with rectal bleeding and is found to have a T2/T3 rectal cancer. Neoadjuvant chemotherapy is planned. The patient received Kefzol prior to the procedure. Hair was clipped from the left chest prior to presentation to the operating room. The chest was prepped with ChloraPrep and draped. Ultrasound was used to confirm patency of the subclavian vein. This was cannulated under ultrasound guidance. The guidewire was advanced and followed by the dilator and cannula. The catheter tip was placed at the junction of the SVC and right atrium. It was tunneled to a pocket on the left anterior chest. The port was transfixed to the deep tissue with 3-point fixation with 3-0 Prolene. It easily irrigated and aspirated with the patient in the supine position.   The wound was closed with a running 3-0 Vicryl suture to the adipose layer and a running 4-0 Vicryl subcuticular suture for the skin. Benzoin, Steri-Strips, Telfa and Tegaderm dressing was then applied.   The patient tolerated the procedure well and was taken to the recovery room where an erect portable chest x-ray showed no evidence of pneumothorax and the catheter tip as described above.    ____________________________ Robert Bellow, MD jwb:si D: 07/23/2012 21:06:00 ET T: 07/23/2012 22:19:56 ET JOB#: [id number]  cc: Robert Bellow, MD, <Dictator> [place] MD ELECTRONICALLY SIGNED 07/25/2012 11:20

## 2014-09-09 NOTE — Consult Note (Signed)
Reason for Visit: This 74 year old Male patient presents to the clinic for initial evaluation of  rectal cancer .   Referred by Dr. Hervey Ard.  Diagnosis:  Chief Complaint/Diagnosis   74 year old male with clinical stage III (T3, N2, M0) adenocarcinoma of the rectum  Pathology Report pathology report reviewed   Imaging Report CT scan reviewed   Referral Report clinical notes reviewed   Planned Treatment Regimen preoperative chemoradiation   HPI   patient is a pleasant 74 year old male who by his own account presented with a several month history of rectal bleeding of which she first ignored. He eventually sought medical attention and lower endoscopy showed a large mass at 7 cm. CT scan confirmed a 5.5 x 7 cm rectal mass with numerous small subcentimeter meter perirectal lymph nodes. Biopsy was obtained and positive for adenocarcinoma. No other areas of metastatic disease on CT scan were appreciated. Tumor was intramucosal at the time of pathologic examination. Based on the locally advanced nature of the lesion patient is referred for opinion regarding preoperative chemoradiation. He is doing fairly well. Does see blood on a toilet tissue every time he has a bowel movement. He is also having some frequent bowel movements tending towards diarrhea. No significant rectal pain.  Past Hx:    Rectal Cancer:    hypertension:    diabetic:   Past, Family and Social History:  Past Medical History positive   Cardiovascular hypertension   Endocrine diabetes mellitus   Family History noncontributory   Social History noncontributory   Additional Past Medical and Surgical History accompanied by his cousin today   Allergies:   Aspirin: Unknown  Home Meds:  Home Medications: Medication Instructions Status  Anusol-HC 25 mg rectal suppository 1 suppository(ies) rectal 2 times a day x 12 days plus 2 refills.  Active  atenolol tablet 25 mg 1 tab(s) orally once a day  Active   amlodipine tablet 5 mg 1 tab(s) orally once a day  Active  Saw Palmetto 450 mg. 1 cap(s) orally as directed Active  Tucks 50% rectal pad 1 each rectal 4 times a day, As Needed Active  metformin 500 mg oral tablet 2  orally once a day Active  atorvastatin 10 mg oral tablet 1 tab(s) orally once a day (at bedtime) Active  Epifoam 1%-1% topical foam Apply topically to affected area 3 times a day Active   Review of Systems:  General negative   Performance Status (ECOG) 0   Skin negative   Breast negative   Ophthalmologic negative   ENMT negative   Respiratory and Thorax negative   Cardiovascular negative   Gastrointestinal see HPI   Genitourinary negative   Musculoskeletal negative   Neurological negative   Psychiatric negative   Hematology/Lymphatics negative   Endocrine negative   Allergic/Immunologic negative   Review of Systems   according to the nurse's notesPatient denies any weight loss, fatigue, weakness, fever, chills or night sweats. Patient denies any loss of vision, blurred vision. Patient denies any ringing  of the ears or hearing loss. No irregular heartbeat. Patient denies heart murmur or history of fainting. Patient denies any chest pain or pain radiating to her upper extremities. Patient denies any shortness of breath, difficulty breathing at night, cough or hemoptysis. Patient denies any swelling in the lower legs. Patient denies any nausea vomiting, vomiting of blood, or coffee ground material in the vomitus. Patient denies any stomach pain. Patient states has had normal bowel movements no significant constipation or diarrhea.  Patient denies any dysuria, hematuria or significant nocturia. Patient denies any problems walking, swelling in the joints or loss of balance. Patient denies any skin changes, loss of hair or loss of weight. Patient denies any excessive worrying or anxiety or significant depression. Patient denies any problems with insomnia. Patient  denies excessive thirst, polyuria, polydipsia. Patient denies any swollen glands, patient denies easy bruising or easy bleeding. Patient denies any recent infections, allergies or URI. Patient "s visual fields have not changed significantly in recent time.  Nursing Notes:  Nursing Vital Signs and Chemo Nursing Nursing Notes: *CC Vital Signs Flowsheet:   20-Feb-14 13:50  Temp Temperature 97.2  Pulse Pulse 82  Respirations Respirations 18  SBP SBP 129  DBP DBP 77  Pain Scale (0-10)  0  Current Weight (kg) (kg) 83.2  Height (cm) centimeters 165  BSA (m2) 1.9   Physical Exam:  General/Skin/HEENT:  General normal   Skin normal   Eyes normal   ENMT normal   Head and Neck normal   Additional PE well-developed slightly obese male in NAD. Lungs are clear to A&P cardiac examination shows regular rate and rhythm. Abdomen is benign with no organomegaly or masses noted. No inguinal adenopathy is identified. No significant peripheral edema in his lower extremities is noted. Rectal exam was deferred today.   Breasts/Resp/CV/GI/GU:  Respiratory and Thorax normal   Cardiovascular normal   Gastrointestinal normal   Genitourinary normal   MS/Neuro/Psych/Lymph:  Musculoskeletal normal   Neurological normal   Lymphatics normal   Other Results:  Radiology Results: LabUnknown:    06-Feb-14 08:35, CT Chest, Abd, and Pelvis With Contrast  PACS Image   CT:  CT Chest, Abd, and Pelvis With Contrast   REASON FOR EXAM:    rectal CA  GI bleed  fatigue  pt takes Metformin  COMMENTS:       PROCEDURE: KCT - KCT CHEST ABDOMEN AND PELVIS W  - Jun 25 2012  8:35AM     RESULT: CT CHEST, ABDOMEN, AND PELVIS    History: Rectal cancer    Comparison: 12/23/2009    Technique: Multiple axial images obtained from the thoracic inlet to the   pubic symphysis, with p.o. contrast and with 100 ml of Isovue-370   intravenous contrast.  Findings:    CHEST:    The lungs are clear. There is no focal  mass. There is no focal   parenchymal opacity, pleural effusion, or pneumothorax.     The heart size is normal. There is no pericardial effusion.     There are no pathologically enlarged mediastinal, hilar, or axillary   lymph nodes.     The osseous structures demonstrateno focal abnormality.     ABDOMEN/PELVIS:  The liver demonstrates no focal abnormality. There is no intrahepatic or   extrahepatic biliary ductal dilatation. The gallbladder is unremarkable.   The spleen demonstrates no focal abnormality. There is a 60mm cystic mid   pancreatic body mass unchanged compared with 12/23/2009 likely representing   a cyst. The kidneys and adrenal glands are normal. The bladder is   unremarkable.     There is a 5.5 x 7 cm rectal mass. There are numerous small subcentimeter   perirectal lymph nodes There is no bowel obstruction. There is no   pneumoperitoneum, pneumatosis, or portal venous gas. There is no   abdominal or pelvic free fluid. There is no lymphadenopathy.     The abdominal aorta is normal in caliber with atherosclerosis.  The osseous structures are unremarkable.  IMPRESSION:     1. There is a 5.5 x 7 cm rectal mass most concerning for carcinoma. There   are numerous small subcentimeter perirectal lymph nodes There is no bowel   obstruction.    Dictation Site: 1        Verified By: Jennette Banker, M.D., MD   Relevent Results:   Relevant Scans and Labs CT scans were reviewed   Assessment and Plan: Impression:   74 year old male with locally advanced adenocarcinoma of the rectum probable clinical stage III lesion Plan:   I have discussed the case personally with Dr. Grayland Ormond who will see the patient consultation this afternoon. Believe in the locally advanced nature of his lesion and positive perirectal lymph nodes he would benefit from preoperative chemoradiation. Would like to treat his whole pelvis to 4500 cGy and if possible excluding all small bowel take his mass  up to 5400 cGy over 6 weeks of treatment. Risks and benefits of treatment of concurrent chemoradiation for rectal cancer was explained in detail to the patient and his cousin. Side effects such as diarrhea, urinary frequency and urgency, fatigue, possible skin reaction, and possible alteration of blood counts were all explained in detail to the patient. Patient will have a Port-A-Cath placed and I have set him up for CT simulation early next week. We will coordinate chemotherapy therapy with our radiation treatments.  I would like to take this opportunity to thank you for allowing me to continue to participate in this patient's care.   CC Referral:  cc: Dr. Hervey Ard, Dr. Benita Stabile   Electronic Signatures: Baruch Gouty, Roda Shutters (MD)  (Signed 20-Feb-14 15:09)  Authored: HPI, Diagnosis, Past Hx, PFSH, Allergies, Home Meds, ROS, Nursing Notes, Physical Exam, Other Results, Relevent Results, Encounter Assessment and Plan, CC Referring Physician   Last Updated: 20-Feb-14 15:09 by Armstead Peaks (MD)

## 2014-09-09 NOTE — Discharge Summary (Signed)
PATIENT NAME:  John Shaw, ENDERLE MR#:  [id number] DATE OF BIRTH:  [birth year]  DATE OF ADMISSION:  12/23/2012 DATE OF DISCHARGE:  12/24/2012  PRIMARY CARE PHYSICIAN: Dr. Hall Busing  DISCHARGE DIAGNOSES:  1.  Acute renal failure.  2.  Hypotension.  3.  Hyponatremia.  4.  Hyperkalemia.  5.  Hypertension.  6.  Diabetes.  7.  Rectal cancer, stage III.   CONDITION: Stable.   CODE STATUS:  FULL CODE.     HOME MEDICATIONS:  Cetirizine10 mg p.o. tablets 1 tab once a day, metformin 500 mg p.o. tablets 1 tab once a day, Norvasc 5 mg p.o. daily, Protonix 40 mg p.o. daily, Colace 100 mg p.o. once a day at bedtime p.r.n. for constipation, atenolol 25 mg p.o. daily.   REFERRAL:  The patient needs Home Health physical therapy.   DIET: Low sodium, low fat, low cholesterol, ADA diet.   ACTIVITY: As tolerated.   FOLLOW-UP CARE: Follow up with PCP within 1 to 2 weeks.   REASON FOR ADMISSION: Dizziness.   HISTORY OF PRESENT ILLNESS: The patient is a 74 year old Caucasian male with a history of rectal cancer, status post resection done about 2 to 3 months ago at [facility]. The patient came to the ED due to dizziness. The patient was found to be hypotensive with blood pressure at 90/40 in the ED.  In addition, the patient was found to have significant acute renal failure. His creatinine 3 weeks prior to this admission was 1.3, but it was above 4 on admission date. For a detailed history and physical examination, please refer to the admission note dictated by Dr. Lenore Manner.   LABORATORY AND RADIOLOGICAL DATA:  On admission date are as following: Chest x-ray showed no acute cardiopulmonary abnormality.  EKG showed normal sinus rhythm at 83 bpm. BUN was 64, creatinine was 4.2, glucose 120, bicarb 17, sodium 128, potassium 5.4. Troponin was less than 0.02. CBC 14,000, hemoglobin 11.6.   HOSPITAL COURSE The patient was admitted for: 1.  Acute renal failure: After admission, the patient has been treated with  normal saline bolus and infusion.  The patient's dizziness has improved. Creatinine decreased from normal range, and BUN decreased to 28.  2.  Hypotension: After normal saline IV, the patient's hypotension has improved. Blood pressure increased to normal range.  3.  Hyperkalemia: The patient was treated with Kayexalate once, and potassium decreased from normal range. The patient's metformin was on hold due to renal failure. In addition, hypertension medications including atenolol, Norvasc and lisinopril were discontinued during hospitalization.  4.  Diabetes:  The patient has been treated with sliding scale.   The patient's symptoms have much improved. Renal function has improved. The patient was clinically stable.  He was discharged on 12/24/2012. According to Physical Therapy evaluation, the patient needs Home Health and physical therapy. I discussed the patient's discharge plan with the patient, case manager and nurse.   TIME SPENT: About 35 minutes.   ____________________________ Demetrios Loll, MD qc:cb D: 12/28/2012 22:14:12 ET T: 12/28/2012 22:26:44 ET JOB#: [id number]  cc: Demetrios Loll, MD, <Dictator> Demetrios Loll MD ELECTRONICALLY SIGNED 12/30/2012 11:11

## 2014-09-09 NOTE — H&P (Signed)
PATIENT NAME:  ETAI, COPADO MR#:  [id number] DATE OF BIRTH:  1940/09/17  DATE OF ADMISSION:  12/23/2012  PRIMARY CARE PHYSICIAN: Benita Stabile.   REFERRING PHYSICIAN: Marta Antu.   CHIEF COMPLAINT: Dizziness.   HISTORY OF PRESENT ILLNESS: Mr. Detter is a 74 year old Caucasian male with a history of rectal cancer, status post resection done about 2 to 3 months ago at [facility]. The patient is also followed up by Dr. Grayland Ormond, but the patient appears to be noncompliant with his chemotherapy followups. The patient is also status post colostomy. He was in his usual state of health until yesterday when he started to develop dizziness and what he describes as feeling rubbery. He had no chest pain. He has no headache. No blurring of vision. No focal weakness. No vomiting. No diarrhea. He called EMS and was transported here to the Emergency Department for evaluation, during which he was found to be hypotensive with a blood pressure of 90/40. The patient usually is hypertensive, and he is on 3 medications to control his blood pressure. Additionally, he was found to have significant acute renal failure. His creatinine 3 weeks ago was 1.3. Now, it is above 4. There is evidence of mild metabolic acidosis but with normal anion gap and mild hyperkalemia and hyponatremia. The patient was admitted for further evaluation and treatment. Clinically, there are no signs of sepsis.   REVIEW OF SYSTEMS:  CONSTITUTIONAL: Denies any fever. No chills. He has mild fatigue.  EYES: No blurring of vision. No double vision.  ENT: He reports dizziness as described. No hearing impairment. No sore throat. No dysphagia.  CARDIOVASCULAR: No chest pain. No shortness of breath. No edema. No syncope.  RESPIRATORY: No cough. No shortness of breath. No chest pain.  GASTROINTESTINAL: No abdominal pain. No vomiting. No diarrhea. No melena. No hematochezia.  GENITOURINARY: No dysuria or frequency of urination. He is unclear if he  has diminished urine output.  MUSCULOSKELETAL: No joint pain or swelling. No muscular pain or swelling.  INTEGUMENTARY: No skin rash. No ulcers.  NEUROLOGY: No focal weakness. No seizure activity. No headache.  PSYCHIATRY: No anxiety. No depression.  ENDOCRINE: No polyuria or polydipsia. No heat or cold intolerance.   PAST MEDICAL HISTORY:  1. 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  2. In 2011, the patient was admitted to the hospital with dizziness, lightheadedness and near syncope. Etiology was unclear.  3. Diabetes mellitus, type 2.  4. Systemic hypertension   PAST SURGICAL HISTORY: Colon cancer resection and colostomy recently about 3 months ago at [facility].   FAMILY HISTORY: Nothing significant. The patient reports that both parents were healthy. He does not remember any medical illnesses that they had. They are both deceased. The patient has no brothers or sisters.   SOCIAL HISTORY: He never married. He lives at home alone. 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   ADMISSION MEDICATIONS: Amlodipine 5 mg a day, atenolol 25 mg a day, lisinopril 5 mg a day, Protonix 40 mg a day, metformin 500 mg a day, Colace 100 mg a day, Zyrtec 10 mg a day.   ALLERGIES: He has some side effects with ASPIRIN. Not clarified.    PHYSICAL EXAMINATION:  VITAL SIGNS: Blood pressure was 90/43, after IV hydration was 105/61. Pulse 77, respiratory rate 18, temperature 97.8, oxygen saturation 99%.  GENERAL APPEARANCE: Elderly male lying in bed in no acute distress. He feels much better than earlier. His dizziness has  improved.  HEAD AND NECK: No pallor. No icterus. No cyanosis. Ear examination revealed normal hearing, no discharge, no lesions. Examination of the nose showed no discharge, no bleeding, no ulcers. Oropharyngeal examination revealed normal lips and tongue. No oral thrush, no ulcers. Eye examination revealed normal eyelids and conjunctivae. Pupils  about 4 mm, round, equal and sluggishly reactive to light. Neck is supple. No lymphadenopathy. No masses. Trachea at midline.  HEART: Normal S1, S2. No S3, S4. No murmur. No gallop. No carotid bruits.  RESPIRATORY: Normal breathing pattern without use of accessory muscles. No rales. No wheezing.  ABDOMEN: Soft without tenderness. No hepatosplenomegaly. No masses. He has colostomy bag located at the right side of the abdomen, draining well-formed brownish stool. No hernias.  SKIN: No ulcers. No subcutaneous nodules.  MUSCULOSKELETAL: No joint swelling. No clubbing.  NEUROLOGIC: Cranial nerves II through XII were intact. No focal motor deficit.  PSYCHIATRIC: The patient is alert, oriented x3. Mood and affect were normal.   LABORATORY FINDINGS: Chest x-ray showed no acute cardiopulmonary abnormality. EKG showed normal sinus rhythm at rate of 83 per minute. Normal findings. His blood workup showed serum glucose of 120, BUN 64 with a creatinine of 4.2. Three weeks ago on July 24th, his BUN was 29 with a creatinine of 1.3. His sodium now is 128, potassium 5.4. Bicarbonate is 17. Anion gap was 11. Calcium 10.4. Total protein 8.3, albumin 2.9. Normal liver transaminases. Troponin less than 0.02. TSH 2.2. CBC showed white count of 14,000, hemoglobin 11.6 with hematocrit of 34. His baseline hemoglobin ranges between 9 to 11. His platelet count is 323. Urinalysis was unremarkable.   ASSESSMENT:  1. Acute renal failure with creatinine rising from 1.3 to 4.2 in 3 weeks. Etiology of the renal failure is unclear, but the first step is to rule out obstructive uropathy.  2. Hypotension. This is possibly potentiated by the blood pressure medications in the face of advancing renal failure.  3. Mild hyperkalemia.  4. Hyponatremia.  5. Anemia of chronic disease.  6. Rectal cancer stage III, status post resection and placement of colostomy.  7. History of systemic hypertension and noninsulin-dependent diabetes mellitus.     PLAN: Will admit the patient. IV hydration challenge with normal saline is started. His blood pressure is improving now. His dizziness is getting better. Will follow up on his basic metabolic profile and the kidney function. Obtain ultrasound of the kidneys to rule out obstructive uropathy. Obtain nephrology consultation. I will give 1 dose of sodium bicarbonate intravenously to combat the mild metabolic acidosis and the hyperkalemia. The patient received 1 dose of Kayexalate. I will hold metformin due to advancement of renal failure and to avoid metabolic acidosis. Will hold also atenolol, amlodipine and lisinopril. I will place the patient on Accu-Chek and sliding scale. DVT prophylaxis with Heparin subcutaneous. The patient indicates that he does have a Living Will and that his code status is FULL CODE.    Time spent in evaluating this patient and reviewing his medical records took more than 1 hour.   ____________________________ Clovis Pu. Lenore Manner, MD amd:gb D: 12/23/2012 01:30:09 ET T: 12/23/2012 02:40:30 ET JOB#: [id number]  cc: Clovis Pu. Lenore Manner, MD, <Dictator> Ellin Saba MD ELECTRONICALLY SIGNED 12/23/2012 6:22

## 2014-09-09 NOTE — Discharge Summary (Signed)
PATIENT NAME:  John Shaw, NOLT MR#:  [id number] DATE OF BIRTH:  March 20, 1941  DATE OF ADMISSION:  02/02/2013 DATE OF DISCHARGE:  02/04/2013  ADMITTING DIAGNOSIS: Near-syncope as well as syncope in the hospital.   DISCHARGE DIAGNOSES: 1.  Syncope felt to be due to orthostatic hypotension. The patient may have autonomic dysfunction. He was started on Florinef and lower extremity compression stockings. His carotid Dopplers and echocardiogram were nonrevealing. His serum cortisol level was normal. EEG was negative.  2.  Rectal cancer stage III, status post resection and colostomy placed.  3.  Diabetes type 2.  4.  Hypertension.  5.  Status post colon cancer resection and colostomy.   6.  Left hip abscess status post incision and debridement with a JP drain.  7. Acute renal failure felt to be due to dehydration, resolved with IV hydration.   CONSULTANTS: PT/OT, case management.   PERTINENT LABS AND EVALUATIONS: EKG showed normal sinus rhythm. CPK was 46, CK-MB was less than 0.5. Troponin less than 0.02. Glucose 141, BUN 59, creatinine 2.47, sodium was 133, CO2 was 15, alkaline phosphatase 161, total protein 8.3. WBC 9.4, hemoglobin 12.1, platelet count 280. CT scan of the head without contrast showed no acute intracranial processes. Ultrasound Doppler showed minimal atherosclerotic calcifications. Echocardiogram of the heart showed EF 60% to 65%, impaired relaxation of the left ventricle. Cortisol level was 14.1, creatinine on discharge was 1.23.   HOSPITAL COURSE: Please refer to H and P done by the admitting physician on admission. The patient is a 74 year old male with history of rectal cancer, status post resection at [facility] who presented with complaint of feeling pain almost passing out. The patient was admitted to the hospital for further evaluation. He was noticed by nurse to have another episode while sitting. Therefore, work-up included EEG which was negative. He was noted to have orthostatic  hypotension and was dehydrated. He was given IV fluids. Despite this, he was still a little orthostatic with standing; therefore, he was started on Florinef and lower extremity compression stockings. He also was seen by physical therapy and a home nurse and PT have been arranged. At this time, he is stable for discharge.   DISCHARGE MEDICATIONS: Cetirizine 10 daily as needed, metformin 500,one tab p.o. daily, tramadol 50 q. 6 p.r.n., fluticasone 0.1 tablets daily.   DIET: Low sodium.   ACTIVITY: As tolerated with a walker.   DISCHARGE INSTRUCTIONS:  Follow-up with primary M.D. in 1 to 2 weeks. The patient is to wear bilateral lower extremity compression stockings, especially during the day.   NOTE: 32 minutes spent on this discharge.      ____________________________ Lafonda Mosses. Posey Pronto, MD shp:dp D: 02/05/2013 08:40:46 ET T: 02/05/2013 09:23:06 ET JOB#: [id number]  cc: Kameah Rawl H. Posey Pronto, MD, <Dictator> Alric Seton MD ELECTRONICALLY SIGNED 02/05/2013 13:04

## 2014-09-09 NOTE — H&P (Signed)
PATIENT NAME:  John Shaw, John Shaw MR#:  [id number] DATE OF BIRTH:  Nov 26, 1940  DATE OF ADMISSION:  02/02/2013  REFERRING PHYSICIAN: Dr. Jeannie Fend  PRIMARY CARE PHYSICIAN: Dr. Benita Stabile   CHIEF COMPLAINT: Presyncope.   HISTORY OF PRESENT ILLNESS: This is a 74 year old male with significant past medical history of rectal cancer, status post resection at [facility] 4 to 5 months ago. The patient is followed by Dr. Grayland Ormond as well. The patient presents with complaints of presyncope. The patient reports he is currently living at his cousin's daughter, where in the morning he went to bring in the newspaper, he leaned forward, he felt lightheaded and dizzy, and he fell down. He denies any loss of consciousness, any head trauma, any seizure-like activity, any urinary or stool incontinence. He was able to stand up after that. He complains of dizziness, lightheadedness and generalized weakness. The patient reports he does not have a good appetitie and oral intake over the last few days, as well, he is having some increased watery bowl movement through his colostomy tube. The patient had CT abdomen done in the ED which does not show any acute finding, but did show a diffuse cerebral atrophy with hypoattenuation in the periventricular matter compatible with small vessel disease. The patient had basic blood workup done which did show him to be in acute atrial failure with a creatinine of 2.47. His baseline is normal around 1, and did show mild anemia at 12.1. The patient denies any complaints of dysuria or polyuria, any fever, any chills, any cough, any productive sputum. The patient is known to have history of abdominal wounds postop with JP drain in the left hip area which appears to be draining fine, as well in the abdominal area, where it appears to be clean and intact with no discharge or foul-smelling odor. Hospitalist service was requested to admit the patient for further management and treatment of his  presyncope. The patient's EKG showing normal sinus rhythm without significant ST or T wave changes.   PAST MEDICAL HISTORY: 1. 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  2. Diabetes mellitus, type 2.  3. Systemic hypertension.   PAST SURGICAL HISTORY: Colon cancer resection and colostomy recently about 4 to 5 months ago at [facility], with an abscess status post I and D with JP drain in the left hip area, and with a wound in the lower abdomen.   FAMILY HISTORY: The patient denies any history of coronary artery disease or diabetes in the family.   ADMISSION MEDICATIONS: The patient appears to be only on 3 medications, he used to be on hypertensive medication the past, but he reports he stopped taking those for a while. He is currently only on tramadol 50 mg oral every 6 hours as needed for pain, metformin 500 mg oral daily, cetirizine 10 mg oral daily as needed.   ALLERGIES: ASPIRIN.   REVIEW OF SYSTEMS: CONSTITUTIONAL: Denies fever, chills. Complains of generalized weakness and fatigue.  EYES: Denies blurry vision, double vision, inflammation, glaucoma.  ENT: Denies tinnitus, epistaxis, ear pain, or discharge.  RESPIRATORY: Denies cough, wheezing, hemoptysis, dyspnea, COPD.  CARDIOVASCULAR: Denies chest pain, edema, arrhythmia, palpitations, syncope. Had presyncope episode.  GASTROINTESTINAL: Denies nausea, vomiting, diarrhea, abdominal pain, hematemesis, melena. Had increased watery bowel movement in his colostomy.  GENITOURINARY: Denies dysuria, hematuria, renal colic.  ENDOCRINE: Denies polyuria, polydipsia, heat or cold intolerance.  HEMATOLOGIC: Denies anemia, easy bruising, bleeding,     INTEGUMENTARY: Denies acne, rash  or skin lesions.  MUSCULOSKELETAL: Denies any gout, swelling, arthritis or cramps.  NEUROLOGIC: Denies CVA, TIA, ataxia, headache, tremors. Complains of dizziness and lightheadedness.  PSYCHIATRIC: Denies anxiety, insomnia, bipolar disorder or  schizophrenia.   PHYSICAL EXAMINATION:  VITAL SIGNS: Temperature 97.6, pulse 97, respiratory rate 131/71, saturating 90% on room air. GENERAL:  Elderly male lies comfortable in bed, in no apparent distress.  HEENT: Head atraumatic, normocephalic. Pupils equally reactive to light. Pink conjunctivae. Anicteric sclerae. Dry oral mucosa.  NECK: Supple. No thyromegaly. No JVD.  CHEST: Good air entry bilaterally. No wheezing, rales, rhonchi.  CARDIOVASCULAR: S1, S2 heard. No rubs, murmurs, or gallops.  ABDOMEN: Soft, nontender, nondistended. Bowel sounds present. Has a colostomy tube in the right abdomen with no bleeding or discharge from site. Bag is full with watery stools. The patient had in the suprapubic area wound, which appears to be packed ,NO drain or discharge from site, as well in the left hip area he has a JP drain, which is draining sanguineous material. No discharge or bleed could be appreciated from the site.   EXTREMITIES: No edema. No clubbing. No cyanosis. The left pedis pulse +2 bilaterally. SKIN: Warm and dry. No rash.  PSYCHIATRIC: The patient appears to be mildly confused, but he is awake, alert x 3 able to know the year but not the month, thinks it is October instead of September.  NEUROLOGIC: Cranial nerves grossly intact. Strength 5/5. No focal deficits.   PERTINENT LABS: Glucose 141, BUN 59, creatinine 2.47, sodium 133, potassium 4.4, chloride 107, CO2 15. Troponin less than 0.02. White blood cell 9.4, hemoglobin 12.1, hematocrit 35.7, platelets 280. EKG showing normal sinus rhythm at 96 beats per minute. CT head without contrast showing diffuse cerebral atrophy with hypoattenuation in the periventricular white matter compatible with small vessel ischemia. There is no hemorrhage or cortical infarct.   ASSESSMENT AND PLAN: 1. Presyncope. This is most likely due to dehydration and volume depletion as patient reports he has decreased p.o. intake. As well, he is having watery bowel  movement/diarrhea through his colostomy bag. The patient denies any chest pain, any shortness of breath, any loss of consciousness. He will be admitted to telemetry unit. We will cycle his cardiac enzymes. We will  check urinalysis. We will check orthostatics.  this is most likely due to volume depletion and dehydration, so will have him on IV fluids. So far he got 1 liter. We will bolus him another 500 mL of normal saline and I will continue him 100 mL per hour. We will  keep him on fall precautions.  2. Acute renal failure. This is due to volume depletion. We will continue the patient on IV fluids as well as check his bladder scan.  3. Diabetes mellitus. We will hold his metformin secondary to renal failure and will have him on insulin sliding scale.  4. Rectal cancer: Stage III status post resection and colostomy placement. The patient will continue to follow with oncology at [facility] surgery as an outpatient. We will consult wound care to followup on the patient's abdominal wound, JP drain and colostomy.  5. Systemic hypertension. The patient has currently been off his meds for a few weeks. We will  not resume his meds as his blood pressure appears to be acceptable. If anything, he might be orthostatic.  6. Deep vein thrombosis prophylaxis. Subcutaneous heparin.  7. Code status.  The patient is full code.   TOTAL TIME SPENT ON ADMISSION AND PATIENT CARE: 50 minutes.  ____________________________ Albertine Patricia, MD dse:sg D: 02/02/2013 07:34:00 ET T: 02/02/2013 08:00:21 ET JOB#: [id number]  cc: Albertine Patricia, MD, <Dictator> DAWOOD Graciela Husbands MD ELECTRONICALLY SIGNED 02/03/2013 2:40

## 2016-02-25 ENCOUNTER — Emergency Department: Payer: Medicare Other

## 2016-02-25 ENCOUNTER — Emergency Department
Admission: EM | Admit: 2016-02-25 | Discharge: 2016-02-25 | Disposition: A | Discharge status: Home / Self Care | Payer: Medicare Other | Attending: Emergency Medicine | Admitting: Emergency Medicine

## 2016-02-25 ENCOUNTER — Encounter: Payer: Self-pay | Admitting: Emergency Medicine

## 2016-02-25 DIAGNOSIS — Z7984 Long term (current) use of oral hypoglycemic drugs: Secondary | ICD-10-CM | POA: Diagnosis not present

## 2016-02-25 DIAGNOSIS — R42 Dizziness and giddiness: Secondary | ICD-10-CM | POA: Diagnosis not present

## 2016-02-25 DIAGNOSIS — Z79899 Other long term (current) drug therapy: Secondary | ICD-10-CM | POA: Insufficient documentation

## 2016-02-25 DIAGNOSIS — Z85048 Personal history of other malignant neoplasm of rectum, rectosigmoid junction, and anus: Secondary | ICD-10-CM | POA: Diagnosis not present

## 2016-02-25 DIAGNOSIS — I10 Essential (primary) hypertension: Secondary | ICD-10-CM | POA: Insufficient documentation

## 2016-02-25 DIAGNOSIS — H9201 Otalgia, right ear: Secondary | ICD-10-CM | POA: Diagnosis present

## 2016-02-25 DIAGNOSIS — E119 Type 2 diabetes mellitus without complications: Secondary | ICD-10-CM | POA: Insufficient documentation

## 2016-02-25 LAB — URINALYSIS COMPLETE WITH MICROSCOPIC (ARMC ONLY)
BACTERIA UA: NONE SEEN
Bilirubin Urine: NEGATIVE
Glucose, UA: NEGATIVE mg/dL
Hgb urine dipstick: NEGATIVE
Ketones, ur: NEGATIVE mg/dL
LEUKOCYTES UA: NEGATIVE
Nitrite: NEGATIVE
PH: 7 (ref 5.0–8.0)
PROTEIN: NEGATIVE mg/dL
Specific Gravity, Urine: 1.014 (ref 1.005–1.030)

## 2016-02-25 LAB — BASIC METABOLIC PANEL
Anion gap: 7 (ref 5–15)
BUN: 21 mg/dL — AB (ref 6–20)
CHLORIDE: 106 mmol/L (ref 101–111)
CO2: 24 mmol/L (ref 22–32)
Calcium: 8.9 mg/dL (ref 8.9–10.3)
Creatinine, Ser: 1.22 mg/dL (ref 0.61–1.24)
GFR calc Af Amer: >60 mL/min (ref >60)
GFR calc non Af Amer: 57 mL/min — ABNORMAL LOW (ref >60)
Glucose, Bld: 93 mg/dL (ref 65–99)
POTASSIUM: 4.1 mmol/L (ref 3.5–5.1)
SODIUM: 137 mmol/L (ref 135–145)

## 2016-02-25 LAB — CBC
HEMATOCRIT: 37.3 % — AB (ref 40.0–52.0)
Hemoglobin: 13.5 g/dL (ref 13.0–18.0)
MCH: 32.7 pg (ref 26.0–34.0)
MCHC: 36.1 g/dL — ABNORMAL HIGH (ref 32.0–36.0)
MCV: 90.6 fL (ref 80.0–100.0)
Platelets: 162 10*3/uL (ref 150–440)
RBC: 4.12 MIL/uL — AB (ref 4.40–5.90)
RDW: 13.4 % (ref 11.5–14.5)
WBC: 5.2 10*3/uL (ref 3.8–10.6)

## 2016-02-25 MED ORDER — MECLIZINE HCL 12.5 MG PO TABS: 12.5000 mg | ORAL_TABLET | Freq: Three times a day (TID) | ORAL | 1 refills | Status: DC | PRN

## 2016-02-25 NOTE — ED Notes (Signed)
Ear irrigated with successful outcome.

## 2016-02-25 NOTE — ED Triage Notes (Signed)
Pt comes into the ED via EMS from home c/o weakness that began after he woke up from his nap.  Patient describes it as a generalized "spinning" ever since he woke up at 16:00.  Patient went to sleep without any complications.  Patient also states his right ear feels "stopped up" and it is causing pain.  Patient presents in NAD at this time with even and unlabored respirations and NSR.  No orthostatic changes per EMS, 175/86, 80 HR, CBG 161, no meds at home, and NKDA.

## 2016-02-25 NOTE — Discharge Instructions (Signed)
Please return immediately if condition worsens. Please contact her primary physician or the physician you were given for referral. If you have any specialist physicians involved in her treatment and plan please also contact them. Thank you for using [facility] emergency [facility]. ° °

## 2016-02-25 NOTE — ED Provider Notes (Signed)
Time Seen: Approximately1702  I have reviewed the triage notes  Chief Complaint: Dizziness and Otalgia   History of Present Illness: John Shaw is a 75 y.o. male who presents with some feelings of spinning sensation after waking up from a nap at 4:00 this afternoon. He describes some right ear discomfort and feeling like his ears are "" stopped up"". He denies any feelings of lightheadedness, chest pain, shortness of breath, focal weakness in either upper or lower extremities, no difficulty with speech or swallowing. Patient has a history of hypertension and non-insulin-dependent diabetes. He states he is currently not on any medications. He is noted to be hypertensive here in emergency department. He denies any chest pains, shortness of breath, or any other focal findings. Denies any blurred vision, loss of vision etc.   Past Medical History:  Diagnosis Date  . Diabetes mellitus without complication ([place])   . Hypertension   . Rectal cancer [facility]) 2014    Patient Active Problem List   Diagnosis Date Noted  . Diabetes mellitus without complication ([place])   . Hypertension   . Rectal cancer [facility])     Past Surgical History:  Procedure Laterality Date  . NO PAST SURGERIES      Past Surgical History:  Procedure Laterality Date  . NO PAST SURGERIES      Current Outpatient Rx  . Order #: [id number] Class: Historical Med  . Order #: [id number] Class: Historical Med  . Order #: [id number] Class: Historical Med  . Order #: [id number] Class: Historical Med  . Order #: [id number] Class: Print  . Order #: [id number] Class: Historical Med  . Order #: [id number] Class: Historical Med  . Order #: [id number] Class: Historical Med  . Order #: [id number] Class: Historical Med    Allergies:  Review of patient's allergies indicates no known allergies.  Family History: No family history on file.  Social History: Social History  Substance Use Topics  . Smoking status: Never Smoker  . Smokeless  tobacco: Never Used  . Alcohol use No     Review of Systems:   10 point review of systems was performed and was otherwise negative:  Constitutional: No fever Eyes: No visual disturbances ENT: No sore throat, ear pain Cardiac: No chest pain Respiratory: No shortness of breath, wheezing, or stridor Abdomen: No abdominal pain, no vomiting, No diarrhea Endocrine: No weight loss, No night sweats Extremities: No peripheral edema, cyanosis Skin: No rashes, easy bruising Neurologic: No focal weakness, trouble with speech or swollowing Urologic: No dysuria, Hematuria, or urinary frequency   Physical Exam:  ED Triage Vitals  Enc Vitals Group     BP 02/25/16 1649 (!) 179/68     Pulse Rate 02/25/16 1649 63     Resp 02/25/16 1649 18     Temp 02/25/16 1649 97.8 F (36.6 C)     Temp Source 02/25/16 1649 Oral     SpO2 02/25/16 1649 99 %     Weight 02/25/16 1650 153 lb 6.4 oz (69.6 kg)     Height 02/25/16 1650 5\' 10"  (1.778 m)     Head Circumference --      Peak Flow --      Pain Score 02/25/16 1650 2     Pain Loc --      Pain Edu? --      Excl. in [place]? --     General: Awake , Alert , and Oriented times 3; GCS 15 Head: Normal cephalic , atraumatic Eyes: Pupils equal , round, reactive to light. No  obvious nystagmus Examination of the left ear shows a narrowed ear canal. No erythema or exudate or drainage Examination of the right tympanic membrane shows it's nonvisible significant wax in the right external ear canal no obvious lesions Nose/Throat: No nasal drainage, patent upper airway without erythema or exudate.  Neck: Supple, Full range of motion, No anterior adenopathy or palpable thyroid masses Lungs: Clear to ascultation without wheezes , rhonchi, or rales Heart: Regular rate, regular rhythm without murmurs , gallops , or rubs Abdomen: Soft, non tender without rebound, guarding , or rigidity; bowel sounds positive and symmetric in all 4 quadrants. No organomegaly .         Extremities: 2 plus symmetric pulses. No edema, clubbing or cyanosis Neurologic: normal ambulation, Motor symmetric without deficits, sensory intact. Negative cerebellar signs Negative drift Skin: warm, dry, no rashes   Labs:   All laboratory work was reviewed including any pertinent negatives or positives listed below:  Labs Reviewed  BASIC METABOLIC PANEL - Abnormal; Notable for the following:       Result Value   BUN 21 (*)    GFR calc non Af Amer 57 (*)    All other components within normal limits  CBC - Abnormal; Notable for the following:    RBC 4.12 (*)    HCT 37.3 (*)    MCHC 36.1 (*)    All other components within normal limits  URINALYSIS COMPLETEWITH MICROSCOPIC ([facility] ONLY) - Abnormal; Notable for the following:    Color, Urine YELLOW (*)    APPearance CLEAR (*)    Squamous Epithelial / LPF 0-5 (*)    All other components within normal limits  Laboratory work was reviewed and showed no clinically significant abnormalities.   EKG:  ED ECG REPORT I, Daymon Larsen, the attending physician, personally viewed and interpreted this ECG.  Date: 02/25/2016 EKG Time: 1647 Rate:72 Rhythm: normal sinus rhythm QRS Axis: normal Intervals: normal ST/T Wave abnormalities: normal Conduction Disturbances: none Narrative Interpretation: unremarkable Normal EKG   Radiology:  "Ct Head Wo Contrast  Result Date: 02/25/2016 CLINICAL DATA:  Vertigo.  Right ear pain. EXAM: CT HEAD WITHOUT CONTRAST TECHNIQUE: Contiguous axial images were obtained from the base of the skull through the vertex without intravenous contrast. COMPARISON:  February 02, 2013 FINDINGS: Brain: No subdural, epidural, or subarachnoid hemorrhage. Cerebellum, brainstem, and basal cisterns are normal. No mass, mass effect, or midline shift. Ventricles and sulci are stable. Moderate white matter changes are identified in similar in the interval. No acute cortical ischemia or infarct. Vascular: Atherosclerosis  in the intracranial carotid arteries. Skull: Normal. Negative for fracture or focal lesion. Sinuses/Orbits: Suggested polyps or mucous retention cysts in the ethmoid sinuses, the nasal cavity, and the right maxillary sinus. Mastoid air cells and middle ears are well aerated. Other: No other abnormalities. IMPRESSION: No acute intracranial process. Electronically Signed   By: Dorise Bullion III M.D   On: 02/25/2016 18:04  " * I personally reviewed the radiologic studies    ED Course:  The patient's stay here was uneventful and his vertiginous type spinning sensation resolved with the nurses irrigating out in his right ear canal. I reexamined his tympanic membrane and ear canal which does not appear to have any erythema, exudate or drainage. Patient was up and ambulatory without difficulty. His blood pressure remains elevated and he was advised to contact his primary physician for further discussion of outpatient management and rechecking both his blood pressure and his blood sugar etc. Dens  today did not exhibit any issues of central vertigo or ischemic stroke. Clinical Course     Assessment:  Peripheral vertigo Right ear canal cerumen obstruction Asymptomatic hypertension   Final Clinical Impression:   Final diagnoses:  Vertigo     Plan:  Outpatient " Discharge Medication List as of 02/25/2016  7:53 PM    START taking these medications   Details  meclizine (ANTIVERT) 12.5 MG tablet Take 1 tablet (12.5 mg total) by mouth 3 (three) times daily as needed for dizziness or nausea., Starting Sun 02/25/2016, Print      " Patient was advised to return immediately if condition worsens. Patient was advised to follow up with their primary care physician or other specialized physicians involved in their outpatient care. The patient and/or family member/power of attorney had laboratory results reviewed at the bedside. All questions and concerns were addressed and appropriate discharge  instructions were distributed by the nursing staff.             Daymon Larsen, MD 02/25/16 2017

## 2017-05-07 ENCOUNTER — Other Ambulatory Visit: Payer: Self-pay | Admitting: Internal Medicine

## 2017-05-07 DIAGNOSIS — Z85048 Personal history of other malignant neoplasm of rectum, rectosigmoid junction, and anus: Secondary | ICD-10-CM

## 2017-05-07 DIAGNOSIS — R634 Abnormal weight loss: Secondary | ICD-10-CM

## 2017-05-19 ENCOUNTER — Ambulatory Visit
Admission: RE | Admit: 2017-05-19 | Discharge: 2017-05-19 | Disposition: A | Discharge status: Home / Self Care | Payer: Medicare Other | Source: Ambulatory Visit | Attending: Internal Medicine | Admitting: Internal Medicine

## 2017-05-19 DIAGNOSIS — K8020 Calculus of gallbladder without cholecystitis without obstruction: Secondary | ICD-10-CM | POA: Diagnosis not present

## 2017-05-19 DIAGNOSIS — I700 Atherosclerosis of aorta: Secondary | ICD-10-CM | POA: Insufficient documentation

## 2017-05-19 DIAGNOSIS — R634 Abnormal weight loss: Secondary | ICD-10-CM | POA: Diagnosis present

## 2017-05-19 DIAGNOSIS — Z85048 Personal history of other malignant neoplasm of rectum, rectosigmoid junction, and anus: Secondary | ICD-10-CM | POA: Insufficient documentation

## 2017-05-19 LAB — POCT I-STAT CREATININE: CREATININE: 1.1 mg/dL (ref 0.61–1.24)

## 2017-05-19 MED ORDER — IOPAMIDOL (ISOVUE-300) INJECTION 61%
100.0000 mL | Freq: Once | INTRAVENOUS | Status: AC | PRN
  Administered 2017-05-19: 100 mL via INTRAVENOUS

## 2017-06-06 ENCOUNTER — Other Ambulatory Visit: Payer: Self-pay

## 2017-06-06 ENCOUNTER — Inpatient Hospital Stay: Payer: Medicare Other | Attending: Oncology | Admitting: Oncology

## 2017-06-06 ENCOUNTER — Inpatient Hospital Stay: Payer: Medicare Other

## 2017-06-06 VITALS — BP 173/83 | HR 73 | Temp 97.5°F | Resp 20 | Wt 125.0 lb

## 2017-06-06 DIAGNOSIS — R634 Abnormal weight loss: Secondary | ICD-10-CM | POA: Insufficient documentation

## 2017-06-06 DIAGNOSIS — Z85048 Personal history of other malignant neoplasm of rectum, rectosigmoid junction, and anus: Secondary | ICD-10-CM | POA: Diagnosis not present

## 2017-06-06 DIAGNOSIS — C20 Malignant neoplasm of rectum: Secondary | ICD-10-CM

## 2017-06-06 LAB — CBC WITH DIFFERENTIAL/PLATELET
BASOS PCT: 0 %
Basophils Absolute: 0 10*3/uL (ref 0–0.1)
EOS PCT: 2 %
Eosinophils Absolute: 0.1 10*3/uL (ref 0–0.7)
HCT: 38.8 % — ABNORMAL LOW (ref 40.0–52.0)
Hemoglobin: 13.4 g/dL (ref 13.0–18.0)
LYMPHS ABS: 1.2 10*3/uL (ref 1.0–3.6)
Lymphocytes Relative: 19 %
MCH: 31.4 pg (ref 26.0–34.0)
MCHC: 34.5 g/dL (ref 32.0–36.0)
MCV: 91 fL (ref 80.0–100.0)
MONO ABS: 0.3 10*3/uL (ref 0.2–1.0)
MONOS PCT: 6 %
NEUTROS PCT: 73 %
Neutro Abs: 4.5 10*3/uL (ref 1.4–6.5)
PLATELETS: 179 10*3/uL (ref 150–440)
RBC: 4.26 MIL/uL — ABNORMAL LOW (ref 4.40–5.90)
RDW: 14.4 % (ref 11.5–14.5)
WBC: 6.1 10*3/uL (ref 3.8–10.6)

## 2017-06-06 LAB — COMPREHENSIVE METABOLIC PANEL
ALK PHOS: 86 U/L (ref 38–126)
ALT: 13 U/L — AB (ref 17–63)
AST: 19 U/L (ref 15–41)
Albumin: 4 g/dL (ref 3.5–5.0)
Anion gap: 7 (ref 5–15)
BUN: 28 mg/dL — AB (ref 6–20)
CALCIUM: 8.7 mg/dL — AB (ref 8.9–10.3)
CHLORIDE: 104 mmol/L (ref 101–111)
CO2: 26 mmol/L (ref 22–32)
CREATININE: 0.95 mg/dL (ref 0.61–1.24)
GFR calc Af Amer: >60 mL/min (ref >60)
GFR calc non Af Amer: >60 mL/min (ref >60)
Glucose, Bld: 102 mg/dL — ABNORMAL HIGH (ref 65–99)
Potassium: 4.4 mmol/L (ref 3.5–5.1)
SODIUM: 137 mmol/L (ref 135–145)
Total Bilirubin: 0.7 mg/dL (ref 0.3–1.2)
Total Protein: 7.8 g/dL (ref 6.5–8.1)

## 2017-06-06 NOTE — Progress Notes (Signed)
[place]  Telephone:([phone]) [phone] Fax:(336) [phone]  ID: John Shaw OB: Nov 01, 1940  MR#: [id number]  QQI#:[id number]  John Shaw Care Team: Albina Billet, MD as PCP - General (Unknown Physician Specialty) Robert Bellow, MD (General Surgery)  CHIEF COMPLAINT: History of rectal cancer now with unintentional weight loss.  INTERVAL HISTORY: John Shaw is a 77 year old John Shaw who was last evaluated in clinic greater than 5 years ago and has a history of rectal cancer.  He received neoadjuvant 5-FU and XRT and this was followed by colon resection at [facility].  John Shaw was lost to follow-up and did not receive adjuvant chemotherapy as recommended.  Recently he noted a greater than 50 pound unintentional weight loss accompanied by significant diarrhea.  Subsequent CT scan was concerning for recurrence.  He otherwise feels well.  He denies any pain.  He has no neurologic complaints.  He denies any recent fevers or illnesses.  He has no chest pain, shortness of breath, cough, or hemoptysis.  He denies any nausea, vomiting, or constipation.  He denies any melena or hematochezia.  He has no urinary complaints.  John Shaw offers no further specific complaints today.  REVIEW OF SYSTEMS:   Review of Systems  Constitutional: Positive for weight loss. Negative for fever and malaise/fatigue.  Respiratory: Negative.  Negative for cough, hemoptysis and shortness of breath.   Cardiovascular: Negative.  Negative for chest pain and leg swelling.  Gastrointestinal: Positive for diarrhea. Negative for abdominal pain, blood in stool, constipation and melena.  Genitourinary: Negative.  Negative for dysuria.  Musculoskeletal: Negative.   Skin: Negative.  Negative for rash.  Neurological: Negative.  Negative for sensory change and weakness.  Psychiatric/Behavioral: Negative.  The John Shaw is not nervous/anxious.     As per HPI. Otherwise, a complete review of systems is negative.  PAST MEDICAL  HISTORY: Past Medical History:  Diagnosis Date  . Diabetes mellitus without complication ([place])   . Hypertension   . Rectal cancer ([place]) 2014    PAST SURGICAL HISTORY: Past Surgical History:  Procedure Laterality Date  . NO PAST SURGERIES      FAMILY HISTORY: No family history on file.  ADVANCED DIRECTIVES (Y/N):  N  HEALTH MAINTENANCE: Social History   Tobacco Use  . Smoking status: Never Smoker  . Smokeless tobacco: Never Used  Substance Use Topics  . Alcohol use: No  . Drug use: No     Colonoscopy:  PAP:  Bone density:  Lipid panel:  No Known Allergies  Current Outpatient Medications  Medication Sig Dispense Refill  . amLODipine (NORVASC) 10 MG tablet Take 5 mg by mouth daily.     Marland Kitchen atenolol (TENORMIN) 25 MG tablet Take 25 mg by mouth daily.    . hydrocortisone (ANUSOL-HC) 25 MG suppository Place 25 mg rectally as needed.     Marland Kitchen lisinopril (PRINIVIL,ZESTRIL) 5 MG tablet Take 5 mg by mouth daily.    . meclizine (ANTIVERT) 12.5 MG tablet Take 1 tablet (12.5 mg total) by mouth 3 (three) times daily as needed for dizziness or nausea. (John Shaw not taking: Reported on 06/06/2017) 30 tablet 1  . metFORMIN (GLUMETZA) 500 MG (MOD) 24 hr tablet Take 1,000 mg by mouth daily.     . ondansetron (ZOFRAN) 4 MG tablet Take 4 mg by mouth every 6 (six) hours as needed. Nausea/vomiting    . pramoxine-hydrocortisone 1-1 % foam Apply 1 application topically 3 (three) times daily as needed (hemmorroid pain).     . saw palmetto 500 MG capsule  Take 500 mg by mouth daily.     No current facility-administered medications for this visit.     OBJECTIVE: Vitals:   06/06/17 1221  BP: (!) 173/83  Pulse: 73  Resp: 20  Temp: (!) 97.5 F (36.4 C)     Body mass index is 22.14 kg/m.    ECOG FS:0 - Asymptomatic  General: Well-developed, well-nourished, no acute distress. Eyes: Pink conjunctiva, anicteric sclera. HEENT: Normocephalic, moist mucous membranes, clear oropharnyx. Lungs: Clear  to auscultation bilaterally. Heart: Regular rate and rhythm. No rubs, murmurs, or gallops. Abdomen: Soft, nontender, nondistended. No organomegaly noted, normoactive bowel sounds. Musculoskeletal: No edema, cyanosis, or clubbing. Neuro: Alert, answering all questions appropriately. Cranial nerves grossly intact. Skin: No rashes or petechiae noted. Psych: Normal affect. Lymphatics: No cervical, calvicular, axillary or inguinal LAD.   LAB RESULTS:  Lab Results  Component Value Date   NA 137 02/25/2016   K 4.1 02/25/2016   CL 106 02/25/2016   CO2 24 02/25/2016   GLUCOSE 93 02/25/2016   BUN 21 (H) 02/25/2016   CREATININE 1.10 05/19/2017   CALCIUM 8.9 02/25/2016   PROT 8.3 (H) 02/02/2013   ALBUMIN 3.4 02/02/2013   AST 25 02/02/2013   ALT 39 02/02/2013   ALKPHOS 161 (H) 02/02/2013   BILITOT 0.6 02/02/2013   GFRNONAA 57 (L) 02/25/2016   GFRAA >60 02/25/2016    Lab Results  Component Value Date   WBC 5.2 02/25/2016   NEUTROABS 5.7 02/03/2013   HGB 13.5 02/25/2016   HCT 37.3 (L) 02/25/2016   MCV 90.6 02/25/2016   PLT 162 02/25/2016     STUDIES: Ct Abdomen Pelvis W Contrast  Result Date: 05/19/2017 CLINICAL DATA:  History of rectal cancer in 2014 managed surgically. History of 50 pound weight loss and diarrhea. EXAM: CT ABDOMEN AND PELVIS WITH CONTRAST TECHNIQUE: Multidetector CT imaging of the abdomen and pelvis was performed using the standard protocol following bolus administration of intravenous contrast. CONTRAST:  151mL ISOVUE-300 IOPAMIDOL (ISOVUE-300) INJECTION 61% COMPARISON:  01/11/2013 CT abdomen/ pelvis. FINDINGS: Lower chest: Peripheral left lower lobe 3 mm solid pulmonary nodule (series 4/image 8) is stable since 01/11/2013 CT, considered benign. No acute abnormality at the lung bases. Hepatobiliary: Normal liver size. No liver mass. Cholelithiasis. No gallbladder wall thickening or pericholecystic fluid. No biliary ductal dilatation. Pancreas: Normal, with no mass  or duct dilation. Spleen: Normal size. No mass. Adrenals/Urinary Tract: No discrete adrenal nodules. Hypodense 1.1 cm interpolar left renal cortical lesion with density 92 HU (series 7/image 20), increased from 0.5 cm on 01/11/2013. Subcentimeter hypodense renal cortical lesions in the right kidney are too small to characterize and require no follow-up. Normal bladder. Stomach/Bowel: Normal non-distended stomach. Postsurgical changes are noted within the pelvic small bowel with intact appearing enteroenterostomy. No small bowel dilatation or wall thickening. Oral contrast transits to the right colon. Normal appendix. Postsurgical changes are again noted from rectal resection with colo-anal anastomosis. There is large volume of stool throughout the large bowel suggesting constipation. Mild diverticulosis in the remnant distal large bowel, with no acute large bowel wall thickening or significant acute pericolonic fat stranding. Relatively symmetric presacral space soft tissue density abuts the posterior margin of the colo-anal anastomosis and is mildly increased since the most recent comparison study of 01/11/2013. Vascular/Lymphatic: Mildly atherosclerotic nonaneurysmal abdominal aorta. Patent portal, splenic, hepatic and renal veins. No pathologically enlarged lymph nodes in the abdomen or pelvis. Reproductive: Normal size prostate. Other: No pneumoperitoneum, ascites or focal fluid collection. Musculoskeletal: No aggressive  appearing focal osseous lesions. Marked thoracolumbar spondylosis. Diffuse osteopenia. IMPRESSION: 1. Nonspecific soft tissue density in the presacral space abutting the colo-anal anastomosis, which appears increased compared to the most recent comparison CT study of 01/11/2013. Differential includes postsurgical/ posttreatment change (favored) or recurrent neoplasm. Recommend PET-CT for further evaluation given the John Shaw's weight loss and the interval change. 2. Indeterminate small 1.1 cm  interpolar left renal cortical lesion, increased in size since 2014 CT, cannot exclude a slow growing renal cell carcinoma. MRI abdomen without and with IV contrast is recommended for further characterization. 3. Otherwise no adenopathy or findings of metastatic disease in the abdomen or pelvis. 4. Large colonic stool volume, suggesting constipation. 5. Chronic findings include: Aortic Atherosclerosis (ICD10-I70.0). Cholelithiasis. These results will be called to the ordering clinician or representative by the Radiologist Assistant, and communication documented in the PACS or zVision Dashboard. Electronically Signed   By: Ilona Sorrel M.D.   On: 05/19/2017 18:37    ASSESSMENT: History of rectal cancer now with unintentional weight loss.  PLAN:    1.  History of rectal cancer: John Shaw appears to have received neoadjuvant 5-FU and XRT in the spring 2014.  He subsequently underwent surgical resection at [facility] and then was lost to follow-up.  John Shaw reports that he did not receive adjuvant FOLFOX as recommended.  His tumor staging was T3, N2, M0 or stage III.  Recently he has had unintentional weight loss and diarrhea and had a CT scan on May 19, 2017 that was suggestive of possible recurrence of disease.  Will get CEA and PET scan to further evaluate.  John Shaw will return to clinic in 1 week to discuss the results and any additional diagnostic testing necessary. 2.  Unintentional weight loss: Unclear etiology.  PET scan as above. 3.  Port: John Shaw had port placement greater than 5 years ago and states it has not been accessed in many years.  If John Shaw has recurrence of disease, will refer John Shaw back to surgery for port revision.  If his PET scan is negative, will refer back to surgery for port removal.  Approximately 60 minutes was spent in discussion of which greater than 50% was consultation.  John Shaw expressed understanding and was in agreement with this plan. He also understands that He can call  clinic at any time with any questions, concerns, or complaints.   Cancer Staging No matching staging information was found for the John Shaw.  Lloyd Huger, MD   06/06/2017 12:28 PM

## 2017-06-06 NOTE — Progress Notes (Signed)
Patient here today for evaluation regarding weight loss, history of rectal cancer.

## 2017-06-07 LAB — CEA: CEA: 1.9 ng/mL (ref 0.0–4.7)

## 2017-06-09 NOTE — Progress Notes (Signed)
[place]  [phone] Fax:(336) [phone]  ID: John Shaw OB: Sep 23, 1940  MR#: [id number]  TKW#:[id number]  Patient Care Team: Albina Billet, MD as PCP - General (Unknown Physician Specialty) Robert Bellow, MD (General Surgery)  CHIEF COMPLAINT: History of rectal cancer now with unintentional weight loss.  INTERVAL HISTORY: Patient returns to clinic today for discussion of his PET scan results and additional diagnostic planning.  He continues to have diarrhea, but otherwise feels well. He denies any pain.  He has no neurologic complaints.  He denies any recent fevers or illnesses.  He has no chest pain, shortness of breath, cough, or hemoptysis.  He denies any nausea, vomiting, or constipation.  He denies any melena or hematochezia.  He has no urinary complaints.  Patient offers no further specific complaints today.  REVIEW OF SYSTEMS:   Review of Systems  Constitutional: Positive for weight loss. Negative for fever and malaise/fatigue.  Respiratory: Negative.  Negative for cough, hemoptysis and shortness of breath.   Cardiovascular: Negative.  Negative for chest pain and leg swelling.  Gastrointestinal: Positive for diarrhea. Negative for abdominal pain, blood in stool, constipation and melena.  Genitourinary: Negative.  Negative for dysuria.  Musculoskeletal: Negative.   Skin: Negative.  Negative for rash.  Neurological: Negative.  Negative for sensory change and weakness.  Psychiatric/Behavioral: Negative.  The patient is not nervous/anxious.     As per HPI. Otherwise, a complete review of systems is negative.  PAST MEDICAL HISTORY: Past Medical History:  Diagnosis Date  . Diabetes mellitus without complication ([place])   . Hypertension   . Rectal cancer ([place]) 2014    PAST SURGICAL HISTORY: Past Surgical History:  Procedure Laterality Date  . NO PAST SURGERIES      FAMILY HISTORY: History reviewed. No pertinent family  history.  ADVANCED DIRECTIVES (Y/N):  N  HEALTH MAINTENANCE: Social History   Tobacco Use  . Smoking status: Never Smoker  . Smokeless tobacco: Never Used  Substance Use Topics  . Alcohol use: No  . Drug use: No     Colonoscopy:  PAP:  Bone density:  Lipid panel:  No Known Allergies  Current Outpatient Medications  Medication Sig Dispense Refill  . amLODipine (NORVASC) 10 MG tablet Take 5 mg by mouth daily.     Marland Kitchen atenolol (TENORMIN) 25 MG tablet Take 25 mg by mouth daily.    . hydrocortisone (ANUSOL-HC) 25 MG suppository Place 25 mg rectally as needed.     Marland Kitchen lisinopril (PRINIVIL,ZESTRIL) 5 MG tablet Take 5 mg by mouth daily.    . metFORMIN (GLUMETZA) 500 MG (MOD) 24 hr tablet Take 1,000 mg by mouth daily.     . ondansetron (ZOFRAN) 4 MG tablet Take 4 mg by mouth every 6 (six) hours as needed. Nausea/vomiting    . pramoxine-hydrocortisone 1-1 % foam Apply 1 application topically 3 (three) times daily as needed (hemmorroid pain).     . saw palmetto 500 MG capsule Take 500 mg by mouth daily.    . meclizine (ANTIVERT) 12.5 MG tablet Take 1 tablet (12.5 mg total) by mouth 3 (three) times daily as needed for dizziness or nausea. (Patient not taking: Reported on 06/06/2017) 30 tablet 1   No current facility-administered medications for this visit.     OBJECTIVE: Vitals:   06/13/17 1100  BP: (!) 177/96  Pulse: 60  Resp: 20  Temp: (!) 96.7 F (35.9 C)     Body mass index is 21.56 kg/m.  ECOG FS:0 - Asymptomatic  General: Well-developed, well-nourished, no acute distress. Eyes: Pink conjunctiva, anicteric sclera. Lungs: Clear to auscultation bilaterally. Heart: Regular rate and rhythm. No rubs, murmurs, or gallops. Abdomen: Soft, nontender, nondistended. No organomegaly noted, normoactive bowel sounds. Musculoskeletal: No edema, cyanosis, or clubbing. Neuro: Alert, answering all questions appropriately. Cranial nerves grossly intact. Skin: No rashes or petechiae  noted. Psych: Normal affect.   LAB RESULTS:  Lab Results  Component Value Date   NA 137 06/06/2017   K 4.4 06/06/2017   CL 104 06/06/2017   CO2 26 06/06/2017   GLUCOSE 102 (H) 06/06/2017   BUN 28 (H) 06/06/2017   CREATININE 0.95 06/06/2017   CALCIUM 8.7 (L) 06/06/2017   PROT 7.8 06/06/2017   ALBUMIN 4.0 06/06/2017   AST 19 06/06/2017   ALT 13 (L) 06/06/2017   ALKPHOS 86 06/06/2017   BILITOT 0.7 06/06/2017   GFRNONAA >60 06/06/2017   GFRAA >60 06/06/2017    Lab Results  Component Value Date   WBC 6.1 06/06/2017   NEUTROABS 4.5 06/06/2017   HGB 13.4 06/06/2017   HCT 38.8 (L) 06/06/2017   MCV 91.0 06/06/2017   PLT 179 06/06/2017     STUDIES: Ct Abdomen Pelvis W Contrast  Result Date: 05/19/2017 CLINICAL DATA:  History of rectal cancer in 2014 managed surgically. History of 50 pound weight loss and diarrhea. EXAM: CT ABDOMEN AND PELVIS WITH CONTRAST TECHNIQUE: Multidetector CT imaging of the abdomen and pelvis was performed using the standard protocol following bolus administration of intravenous contrast. CONTRAST:  194mL ISOVUE-300 IOPAMIDOL (ISOVUE-300) INJECTION 61% COMPARISON:  01/11/2013 CT abdomen/ pelvis. FINDINGS: Lower chest: Peripheral left lower lobe 3 mm solid pulmonary nodule (series 4/image 8) is stable since 01/11/2013 CT, considered benign. No acute abnormality at the lung bases. Hepatobiliary: Normal liver size. No liver mass. Cholelithiasis. No gallbladder wall thickening or pericholecystic fluid. No biliary ductal dilatation. Pancreas: Normal, with no mass or duct dilation. Spleen: Normal size. No mass. Adrenals/Urinary Tract: No discrete adrenal nodules. Hypodense 1.1 cm interpolar left renal cortical lesion with density 92 HU (series 7/image 20), increased from 0.5 cm on 01/11/2013. Subcentimeter hypodense renal cortical lesions in the right kidney are too small to characterize and require no follow-up. Normal bladder. Stomach/Bowel: Normal non-distended  stomach. Postsurgical changes are noted within the pelvic small bowel with intact appearing enteroenterostomy. No small bowel dilatation or wall thickening. Oral contrast transits to the right colon. Normal appendix. Postsurgical changes are again noted from rectal resection with colo-anal anastomosis. There is large volume of stool throughout the large bowel suggesting constipation. Mild diverticulosis in the remnant distal large bowel, with no acute large bowel wall thickening or significant acute pericolonic fat stranding. Relatively symmetric presacral space soft tissue density abuts the posterior margin of the colo-anal anastomosis and is mildly increased since the most recent comparison study of 01/11/2013. Vascular/Lymphatic: Mildly atherosclerotic nonaneurysmal abdominal aorta. Patent portal, splenic, hepatic and renal veins. No pathologically enlarged lymph nodes in the abdomen or pelvis. Reproductive: Normal size prostate. Other: No pneumoperitoneum, ascites or focal fluid collection. Musculoskeletal: No aggressive appearing focal osseous lesions. Marked thoracolumbar spondylosis. Diffuse osteopenia. IMPRESSION: 1. Nonspecific soft tissue density in the presacral space abutting the colo-anal anastomosis, which appears increased compared to the most recent comparison CT study of 01/11/2013. Differential includes postsurgical/ posttreatment change (favored) or recurrent neoplasm. Recommend PET-CT for further evaluation given the patient's weight loss and the interval change. 2. Indeterminate small 1.1 cm interpolar left renal cortical lesion, increased in size  since 2014 CT, cannot exclude a slow growing renal cell carcinoma. MRI abdomen without and with IV contrast is recommended for further characterization. 3. Otherwise no adenopathy or findings of metastatic disease in the abdomen or pelvis. 4. Large colonic stool volume, suggesting constipation. 5. Chronic findings include: Aortic Atherosclerosis  (ICD10-I70.0). Cholelithiasis. These results will be called to the ordering clinician or representative by the Radiologist Assistant, and communication documented in the PACS or zVision Dashboard. Electronically Signed   By: Ilona Sorrel M.D.   On: 05/19/2017 18:37   Nm Pet Image Restag (ps) Skull Base To Thigh  Result Date: 06/12/2017 CLINICAL DATA:  Subsequent treatment strategy for rectal carcinoma. EXAM: NUCLEAR MEDICINE PET SKULL BASE TO THIGH TECHNIQUE: Twelve mCi F-18 FDG was injected intravenously. Full-ring PET imaging was performed from the skull base to thigh after the radiotracer. CT data was obtained and used for attenuation correction and anatomic localization. FASTING BLOOD GLUCOSE:  Value: 68 mg/dl COMPARISON:  CT 05/19/2017 FINDINGS: NECK No hypermetabolic lymph nodes in the neck. CHEST A branching segmental reticulonodular pattern in the LEFT lower lobe measuring 4.0 by 1.8 cm has moderate metabolic activity with SUV max equal 3.0. New from comparison exam No additional hypermetabolic pulmonary nodules. No hypermetabolic mediastinal lymph nodes. The ABDOMEN/PELVIS Within the presacral space, immediately posterior to the low rectal anastomosis is a focus of intense hypermetabolic activity SUV max equal 8.2. This is positioned between the posterior wall the rectum and the presacral thickening described previously. No discrete measurable mass lesion. Region metabolic activity measures approximately 2 cm x 2 cm and localizes to image 229, series 3. No hypermetabolic peri colonic lymph nodes in the pelvis. No hypermetabolic retroperitoneal lymph nodes or periportal Abnormal activity liver.  Adrenal glands spleen are normal. Multiple gallstones layer within the gallbladder SKELETON No focal hypermetabolic activity to suggest skeletal metastasis. IMPRESSION: 1. Intense focus of radiotracer activity in the presacral space just posterior to the low rectal anastomosis is concerning for rectal carcinoma  recurrence. Chronic infection could have similar appearance but favor recurrence. Consider tissue sampling. 2. No evidence of additional metastatic disease on whole-body scan. 3. Branching reticulonodular pattern in the LEFT lower lobe is new from prior and favored pulmonary infection. Electronically Signed   By: Suzy Bouchard M.D.   On: 06/12/2017 17:33    ASSESSMENT: History of rectal cancer now with unintentional weight loss.  PLAN:    1.  History of rectal cancer: Patient appears to have received neoadjuvant 5-FU and XRT in the spring 2014.  He subsequently underwent surgical resection at [facility] and then was lost to follow-up.  Patient reports that he did not receive adjuvant FOLFOX as recommended.  His tumor staging was T3, N2, M0 or stage III.  Recently he has had unintentional weight loss and diarrhea and had a CT scan on May 19, 2017 that was suggestive of possible recurrence of disease.  CEA is within normal limits.  PET scan results reviewed independently and report as above.  Have ordered CT-guided biopsy of PET positive mass in the presacral space near his rectal anastomosis.  Patient will return to clinic 1 week after the biopsy to discuss the results.   2.  Unintentional weight loss: Possible recurrence of malignancy, biopsy as above.   3.  Port: Patient had port placement greater than 5 years ago and states it has not been accessed in many years.  If patient has recurrence of disease, will refer patient back to surgery for port revision.  If  his PET scan is negative, will refer back to surgery for port removal.  Approximately 30 minutes was spent in discussion of which greater than 50% was consultation.  Patient expressed understanding and was in agreement with this plan. He also understands that He can call clinic at any time with any questions, concerns, or complaints.   Cancer Staging No matching staging information was found for the patient.  Lloyd Huger, MD    06/13/2017 11:51 AM

## 2017-06-12 ENCOUNTER — Ambulatory Visit
Admission: RE | Admit: 2017-06-12 | Discharge: 2017-06-12 | Disposition: A | Discharge status: Home / Self Care | Payer: Medicare Other | Source: Ambulatory Visit | Attending: Oncology | Admitting: Oncology

## 2017-06-12 DIAGNOSIS — K6389 Other specified diseases of intestine: Secondary | ICD-10-CM | POA: Diagnosis not present

## 2017-06-12 DIAGNOSIS — C20 Malignant neoplasm of rectum: Secondary | ICD-10-CM | POA: Insufficient documentation

## 2017-06-12 DIAGNOSIS — R918 Other nonspecific abnormal finding of lung field: Secondary | ICD-10-CM | POA: Diagnosis not present

## 2017-06-12 LAB — GLUCOSE, CAPILLARY: GLUCOSE-CAPILLARY: 68 mg/dL (ref 65–99)

## 2017-06-12 MED ORDER — FLUDEOXYGLUCOSE F - 18 (FDG) INJECTION
12.0000 | Freq: Once | INTRAVENOUS | Status: AC | PRN
  Administered 2017-06-12: 12.02 via INTRAVENOUS

## 2017-06-13 ENCOUNTER — Other Ambulatory Visit: Payer: Self-pay

## 2017-06-13 ENCOUNTER — Encounter: Payer: Self-pay | Admitting: Oncology

## 2017-06-13 ENCOUNTER — Inpatient Hospital Stay (HOSPITAL_BASED_OUTPATIENT_CLINIC_OR_DEPARTMENT_OTHER): Payer: Medicare Other | Admitting: Oncology

## 2017-06-13 VITALS — BP 177/96 | HR 60 | Temp 96.7°F | Resp 20 | Wt 121.7 lb

## 2017-06-13 DIAGNOSIS — Z85048 Personal history of other malignant neoplasm of rectum, rectosigmoid junction, and anus: Secondary | ICD-10-CM | POA: Diagnosis not present

## 2017-06-13 DIAGNOSIS — R634 Abnormal weight loss: Secondary | ICD-10-CM | POA: Diagnosis not present

## 2017-06-13 DIAGNOSIS — C20 Malignant neoplasm of rectum: Secondary | ICD-10-CM

## 2017-06-13 NOTE — Progress Notes (Signed)
Patient denies any concerns today.  

## 2017-06-17 ENCOUNTER — Other Ambulatory Visit: Payer: Self-pay | Admitting: *Deleted

## 2017-06-17 ENCOUNTER — Telehealth: Payer: Self-pay | Admitting: *Deleted

## 2017-06-17 DIAGNOSIS — K6289 Other specified diseases of anus and rectum: Secondary | ICD-10-CM

## 2017-06-17 NOTE — Telephone Encounter (Signed)
John Shaw called from scheduling with appointment for CT guided biopsy. Pt is scheduled for 2/5 at 11:00, arrive at 10:00. I have attempted to notify pts guardian with appointment.

## 2017-06-20 ENCOUNTER — Other Ambulatory Visit: Payer: Self-pay | Admitting: Radiology

## 2017-06-24 ENCOUNTER — Ambulatory Visit
Admission: RE | Admit: 2017-06-24 | Discharge: 2017-06-24 | Disposition: A | Discharge status: Home / Self Care | Payer: Medicare Other | Source: Ambulatory Visit | Attending: Oncology | Admitting: Oncology

## 2017-06-24 DIAGNOSIS — K6289 Other specified diseases of anus and rectum: Secondary | ICD-10-CM

## 2017-06-24 DIAGNOSIS — K629 Disease of anus and rectum, unspecified: Secondary | ICD-10-CM | POA: Diagnosis not present

## 2017-06-24 LAB — CBC
HCT: 39.7 % — ABNORMAL LOW (ref 40.0–52.0)
HEMOGLOBIN: 13.4 g/dL (ref 13.0–18.0)
MCH: 31 pg (ref 26.0–34.0)
MCHC: 33.6 g/dL (ref 32.0–36.0)
MCV: 92.2 fL (ref 80.0–100.0)
Platelets: 188 10*3/uL (ref 150–440)
RBC: 4.31 MIL/uL — AB (ref 4.40–5.90)
RDW: 14.4 % (ref 11.5–14.5)
WBC: 5.1 10*3/uL (ref 3.8–10.6)

## 2017-06-24 LAB — PROTIME-INR
INR: 0.94
PROTHROMBIN TIME: 12.5 s (ref 11.4–15.2)

## 2017-06-24 LAB — APTT: aPTT: 30 seconds (ref 24–36)

## 2017-06-24 MED ORDER — LIDOCAINE HCL 1 % IJ SOLN
INTRAMUSCULAR | Status: AC | PRN
  Administered 2017-06-24: 10 mL via INTRADERMAL

## 2017-06-24 MED ORDER — HYDROCODONE-ACETAMINOPHEN 5-325 MG PO TABS
1.0000 | ORAL_TABLET | ORAL | Status: DC | PRN
  Filled 2017-06-24: qty 2

## 2017-06-24 MED ORDER — MIDAZOLAM HCL 2 MG/2ML IJ SOLN
INTRAMUSCULAR | Status: AC
  Filled 2017-06-24: qty 2

## 2017-06-24 MED ORDER — SODIUM CHLORIDE 0.9 % IV SOLN
INTRAVENOUS | Status: DC
  Administered 2017-06-24: 1000 mL via INTRAVENOUS

## 2017-06-24 MED ORDER — FENTANYL CITRATE (PF) 100 MCG/2ML IJ SOLN
INTRAMUSCULAR | Status: AC | PRN
  Administered 2017-06-24: 50 ug via INTRAVENOUS

## 2017-06-24 MED ORDER — MIDAZOLAM HCL 2 MG/2ML IJ SOLN
INTRAMUSCULAR | Status: AC | PRN
  Administered 2017-06-24: 1 mg via INTRAVENOUS

## 2017-06-24 MED ORDER — FENTANYL CITRATE (PF) 100 MCG/2ML IJ SOLN
INTRAMUSCULAR | Status: AC
  Filled 2017-06-24: qty 2

## 2017-06-24 NOTE — Sedation Documentation (Signed)
Prior to biopsy, note in CT that pt's Twave was elevated appearing on portable monitor. Pt denies chest pain today or in any recent days. Dr Vernard Gambles aware. Hypertension noted.

## 2017-06-24 NOTE — H&P (Signed)
Chief Complaint: Patient was seen in consultation today for presacral mass  Referring Physician(s): Finnegan,Timothy J  Supervising Physician: Arne Cleveland  Patient Status: [facility] - Out-pt  History of Present Illness: John Shaw is a 77 y.o. male with past medical history of DM, HTN, rectal cancer treated in 2014 presents with recent weight loss.   CT Abdomen Pelvis 05/19/17 shows: 1. Nonspecific soft tissue density in the presacral space abutting the colo-anal anastomosis, which appears increased compared to the most recent comparison CT study of 01/11/2013. Differential includes postsurgical/ posttreatment change (favored) or recurrent neoplasm. Recommend PET-CT for further evaluation given the patient's weight loss and the interval change.  PET 06/12/17 shows: 1. Intense focus of radiotracer activity in the presacral space just posterior to the low rectal anastomosis is concerning for rectal carcinoma recurrence. Chronic infection could have similar appearance but favor recurrence. Consider tissue sampling. 2. No evidence of additional metastatic disease on whole-body scan. 3. Branching reticulonodular pattern in the LEFT lower lobe is new from prior and favored pulmonary infection.  Patient presents to radiology department today for biopsy.  He presents in his usual state of health and without complaint.  He has been NPO.  He does not take blood thinners.   Past Medical History:  Diagnosis Date  . Diabetes mellitus without complication ([place])   . Hypertension   . Rectal cancer ([place]) 2014    Past Surgical History:  Procedure Laterality Date  . NO PAST SURGERIES      Allergies: Patient has no known allergies.  Medications: Prior to Admission medications   Medication Sig Start Date End Date Taking? Authorizing Provider  amLODipine (NORVASC) 10 MG tablet Take 5 mg by mouth daily.     [provider]  atenolol (TENORMIN) 25 MG tablet Take  25 mg by mouth daily.    [provider]  hydrocortisone (ANUSOL-HC) 25 MG suppository Place 25 mg rectally as needed.     [provider]  lisinopril (PRINIVIL,ZESTRIL) 5 MG tablet Take 5 mg by mouth daily. 10/02/12   [provider]  meclizine (ANTIVERT) 12.5 MG tablet Take 1 tablet (12.5 mg total) by mouth 3 (three) times daily as needed for dizziness or nausea. Patient not taking: Reported on 06/06/2017 02/25/16   Daymon Larsen, MD  metFORMIN (GLUMETZA) 500 MG (MOD) 24 hr tablet Take 1,000 mg by mouth daily.     [provider]  ondansetron (ZOFRAN) 4 MG tablet Take 4 mg by mouth every 6 (six) hours as needed. Nausea/vomiting 08/01/12   [provider]  pramoxine-hydrocortisone 1-1 % foam Apply 1 application topically 3 (three) times daily as needed (hemmorroid pain).     [provider]  saw palmetto 500 MG capsule Take 500 mg by mouth daily.    [provider]     No family history on file.  Social History   Socioeconomic History  . Marital status: Single    Spouse name: Not on file  . Number of children: Not on file  . Years of education: Not on file  . Highest education level: Not on file  Social Needs  . Financial resource strain: Not on file  . Food insecurity - worry: Not on file  . Food insecurity - inability: Not on file  . Transportation needs - medical: Not on file  . Transportation needs - non-medical: Not on file  Occupational History  . Not on file  Tobacco Use  . Smoking status: Never Smoker  .  Smokeless tobacco: Never Used  Substance and Sexual Activity  . Alcohol use: No  . Drug use: No  . Sexual activity: Not on file  Other Topics Concern  . Not on file  Social History Narrative  . Not on file    Review of Systems  Constitutional: Positive for unexpected weight change. Negative for fatigue and fever.  Respiratory: Negative for cough and shortness of breath.   Cardiovascular: Negative for  chest pain.  Gastrointestinal: Negative for abdominal pain.  Psychiatric/Behavioral: Negative for behavioral problems and confusion.    Vital Signs: There were no vitals taken for this visit.  Physical Exam  Constitutional: He is oriented to person, place, and time. He appears well-developed.  Cardiovascular: Normal rate, regular rhythm and normal heart sounds.  Pulmonary/Chest: Effort normal and breath sounds normal. No respiratory distress.  Abdominal: Soft. There is no tenderness.  Neurological: He is alert and oriented to person, place, and time.  Skin: Skin is warm and dry.  Psychiatric: He has a normal mood and affect. His behavior is normal. Judgment and thought content normal.  Nursing note and vitals reviewed.   ASA 3 Airway 1  Imaging: Nm Pet Image Restag (ps) Skull Base To Thigh  Result Date: 06/12/2017 CLINICAL DATA:  Subsequent treatment strategy for rectal carcinoma. EXAM: NUCLEAR MEDICINE PET SKULL BASE TO THIGH TECHNIQUE: Twelve mCi F-18 FDG was injected intravenously. Full-ring PET imaging was performed from the skull base to thigh after the radiotracer. CT data was obtained and used for attenuation correction and anatomic localization. FASTING BLOOD GLUCOSE:  Value: 68 mg/dl COMPARISON:  CT 05/19/2017 FINDINGS: NECK No hypermetabolic lymph nodes in the neck. CHEST A branching segmental reticulonodular pattern in the LEFT lower lobe measuring 4.0 by 1.8 cm has moderate metabolic activity with SUV max equal 3.0. New from comparison exam No additional hypermetabolic pulmonary nodules. No hypermetabolic mediastinal lymph nodes. The ABDOMEN/PELVIS Within the presacral space, immediately posterior to the low rectal anastomosis is a focus of intense hypermetabolic activity SUV max equal 8.2. This is positioned between the posterior wall the rectum and the presacral thickening described previously. No discrete measurable mass lesion. Region metabolic activity measures approximately  2 cm x 2 cm and localizes to image 229, series 3. No hypermetabolic peri colonic lymph nodes in the pelvis. No hypermetabolic retroperitoneal lymph nodes or periportal Abnormal activity liver.  Adrenal glands spleen are normal. Multiple gallstones layer within the gallbladder SKELETON No focal hypermetabolic activity to suggest skeletal metastasis. IMPRESSION: 1. Intense focus of radiotracer activity in the presacral space just posterior to the low rectal anastomosis is concerning for rectal carcinoma recurrence. Chronic infection could have similar appearance but favor recurrence. Consider tissue sampling. 2. No evidence of additional metastatic disease on whole-body scan. 3. Branching reticulonodular pattern in the LEFT lower lobe is new from prior and favored pulmonary infection. Electronically Signed   By: Suzy Bouchard M.D.   On: 06/12/2017 17:33    Labs:  CBC: Recent Labs    06/06/17 1214  WBC 6.1  HGB 13.4  HCT 38.8*  PLT 179    COAGS: No results for input(s): INR, APTT in the last 8760 hours.  BMP: Recent Labs    05/19/17 1658 06/06/17 1214  NA  --  137  K  --  4.4  CL  --  104  CO2  --  26  GLUCOSE  --  102*  BUN  --  28*  CALCIUM  --  8.7*  CREATININE 1.10 0.95  GFRNONAA  --  >60  GFRAA  --  >60    LIVER FUNCTION TESTS: Recent Labs    06/06/17 1214  BILITOT 0.7  AST 19  ALT 13*  ALKPHOS 86  PROT 7.8  ALBUMIN 4.0    TUMOR MARKERS: No results for input(s): AFPTM, CEA, CA199, CHROMGRNA in the last 8760 hours.  Assessment and Plan: Patient with past medical history of HTN, DM presents with complaint of pre-sacral mass.  IR consulted for biopsy at the request of Dr. Grayland Ormond. Case reviewed by Dr. Vernard Gambles who approves patient for procedure.  Patient presents today in their usual state of health.  He has been NPO and is not currently on blood thinners.  Risks and benefits discussed with the patient including, but not limited to bleeding, infection, damage  to adjacent structures or low yield requiring additional tests.  All of the patient's questions were answered, patient is agreeable to proceed. Consent signed and in chart.  Thank you for this interesting consult.  I greatly enjoyed meeting Kourtland Darryll Raju and look forward to participating in their care.  A copy of this report was sent to the requesting provider on this date.  Electronically Signed: Docia Barrier, PA 06/24/2017, 8:49 AM   I spent a total of  30 Minutes   in face to face in clinical consultation, greater than 50% of which was counseling/coordinating care for presacral mass.

## 2017-06-24 NOTE — Procedures (Signed)
  Procedure: CT core presacral mass   EBL:   minimal Complications:  none immediate  See full dictation in [facility].  Dillard Cannon MD Main # [phone] Pager  [phone]

## 2017-06-25 LAB — SURGICAL PATHOLOGY

## 2017-06-27 NOTE — Progress Notes (Signed)
[place]  Telephone:(336) [phone] [phone]) [phone]  ID: John Shaw OB: Oct 01, 1940  MR#: [id number]  IRC#:[id number]  Patient Care Team: Albina Billet, MD as PCP - General (Unknown Physician Specialty) Robert Bellow, MD (General Surgery)  CHIEF COMPLAINT: History of rectal cancer now with unintentional weight loss.  INTERVAL HISTORY: Patient returns to clinic today for discussion of his biopsy results.  His weight has stabilized and he has gained 3 pounds in the interim.  He continues to have intermittent diarrhea, but otherwise feels well. He denies any pain.  He has no neurologic complaints.  He denies any recent fevers or illnesses.  He has no chest pain, shortness of breath, cough, or hemoptysis.  He denies any nausea, vomiting, or constipation.  He denies any melena or hematochezia.  He has no urinary complaints.  Patient offers no further specific complaints today.  REVIEW OF SYSTEMS:   Review of Systems  Constitutional: Negative.  Negative for fever, malaise/fatigue and weight loss.  Respiratory: Negative.  Negative for cough, hemoptysis and shortness of breath.   Cardiovascular: Negative.  Negative for chest pain and leg swelling.  Gastrointestinal: Positive for diarrhea. Negative for abdominal pain, blood in stool, constipation and melena.  Genitourinary: Negative.  Negative for dysuria.  Musculoskeletal: Negative.   Skin: Negative.  Negative for rash.  Neurological: Negative.  Negative for sensory change and weakness.  Psychiatric/Behavioral: Negative.  The patient is not nervous/anxious.     As per HPI. Otherwise, a complete review of systems is negative.  PAST MEDICAL HISTORY: Past Medical History:  Diagnosis Date  . Diabetes mellitus without complication ([place])   . Hypertension   . Rectal cancer ([place]) 2014    PAST SURGICAL HISTORY: Past Surgical History:  Procedure Laterality Date  . NO PAST SURGERIES      FAMILY  HISTORY: No family history on file.  ADVANCED DIRECTIVES (Y/N):  N  HEALTH MAINTENANCE: Social History   Tobacco Use  . Smoking status: Never Smoker  . Smokeless tobacco: Never Used  Substance Use Topics  . Alcohol use: No  . Drug use: No     Colonoscopy:  PAP:  Bone density:  Lipid panel:  No Known Allergies  Current Outpatient Medications  Medication Sig Dispense Refill  . amLODipine (NORVASC) 10 MG tablet Take 5 mg by mouth daily.     Marland Kitchen atenolol (TENORMIN) 25 MG tablet Take 25 mg by mouth daily.    . hydrocortisone (ANUSOL-HC) 25 MG suppository Place 25 mg rectally as needed.     Marland Kitchen lisinopril (PRINIVIL,ZESTRIL) 5 MG tablet Take 5 mg by mouth daily.    . Melatonin 1 MG CAPS Take by mouth at bedtime.    . metFORMIN (GLUMETZA) 500 MG (MOD) 24 hr tablet Take 1,000 mg by mouth daily.     . ondansetron (ZOFRAN) 4 MG tablet Take 4 mg by mouth every 6 (six) hours as needed. Nausea/vomiting    . pramoxine-hydrocortisone 1-1 % foam Apply 1 application topically 3 (three) times daily as needed (hemmorroid pain).     . saw palmetto 500 MG capsule Take 500 mg by mouth daily.    . meclizine (ANTIVERT) 12.5 MG tablet Take 1 tablet (12.5 mg total) by mouth 3 (three) times daily as needed for dizziness or nausea. (Patient not taking: Reported on 06/06/2017) 30 tablet 1   No current facility-administered medications for this visit.     OBJECTIVE: Vitals:   07/04/17 1421  BP: (!) 165/85  Pulse: 70  Resp: 18  Temp: (!) 97.5 F (36.4 C)     Body mass index is 22.06 kg/m.    ECOG FS:0 - Asymptomatic  General: Well-developed, well-nourished, no acute distress. Eyes: Pink conjunctiva, anicteric sclera. Lungs: Clear to auscultation bilaterally. Heart: Regular rate and rhythm. No rubs, murmurs, or gallops. Abdomen: Soft, nontender, nondistended. No organomegaly noted, normoactive bowel sounds. Musculoskeletal: No edema, cyanosis, or clubbing. Neuro: Alert, answering all questions  appropriately. Cranial nerves grossly intact. Skin: No rashes or petechiae noted. Psych: Normal affect.   LAB RESULTS:  Lab Results  Component Value Date   NA 137 06/06/2017   K 4.4 06/06/2017   CL 104 06/06/2017   CO2 26 06/06/2017   GLUCOSE 102 (H) 06/06/2017   BUN 28 (H) 06/06/2017   CREATININE 0.95 06/06/2017   CALCIUM 8.7 (L) 06/06/2017   PROT 7.8 06/06/2017   ALBUMIN 4.0 06/06/2017   AST 19 06/06/2017   ALT 13 (L) 06/06/2017   ALKPHOS 86 06/06/2017   BILITOT 0.7 06/06/2017   GFRNONAA >60 06/06/2017   GFRAA >60 06/06/2017    Lab Results  Component Value Date   WBC 5.1 06/24/2017   NEUTROABS 4.5 06/06/2017   HGB 13.4 06/24/2017   HCT 39.7 (L) 06/24/2017   MCV 92.2 06/24/2017   PLT 188 06/24/2017     STUDIES: Nm Pet Image Restag (ps) Skull Base To Thigh  Result Date: 06/12/2017 CLINICAL DATA:  Subsequent treatment strategy for rectal carcinoma. EXAM: NUCLEAR MEDICINE PET SKULL BASE TO THIGH TECHNIQUE: Twelve mCi F-18 FDG was injected intravenously. Full-ring PET imaging was performed from the skull base to thigh after the radiotracer. CT data was obtained and used for attenuation correction and anatomic localization. FASTING BLOOD GLUCOSE:  Value: 68 mg/dl COMPARISON:  CT 05/19/2017 FINDINGS: NECK No hypermetabolic lymph nodes in the neck. CHEST A branching segmental reticulonodular pattern in the LEFT lower lobe measuring 4.0 by 1.8 cm has moderate metabolic activity with SUV max equal 3.0. New from comparison exam No additional hypermetabolic pulmonary nodules. No hypermetabolic mediastinal lymph nodes. The ABDOMEN/PELVIS Within the presacral space, immediately posterior to the low rectal anastomosis is a focus of intense hypermetabolic activity SUV max equal 8.2. This is positioned between the posterior wall the rectum and the presacral thickening described previously. No discrete measurable mass lesion. Region metabolic activity measures approximately 2 cm x 2 cm and  localizes to image 229, series 3. No hypermetabolic peri colonic lymph nodes in the pelvis. No hypermetabolic retroperitoneal lymph nodes or periportal Abnormal activity liver.  Adrenal glands spleen are normal. Multiple gallstones layer within the gallbladder SKELETON No focal hypermetabolic activity to suggest skeletal metastasis. IMPRESSION: 1. Intense focus of radiotracer activity in the presacral space just posterior to the low rectal anastomosis is concerning for rectal carcinoma recurrence. Chronic infection could have similar appearance but favor recurrence. Consider tissue sampling. 2. No evidence of additional metastatic disease on whole-body scan. 3. Branching reticulonodular pattern in the LEFT lower lobe is new from prior and favored pulmonary infection. Electronically Signed   By: Suzy Bouchard M.D.   On: 06/12/2017 17:33   Ct Biopsy  Result Date: 06/24/2017 CLINICAL DATA:  History of rectal carcinoma. Recent PET-CT demonstrates hypermetabolic presacral mass. EXAM: CT GUIDED CORE BIOPSY OF PRESACRAL MASS ANESTHESIA/SEDATION: Intravenous Fentanyl and Versed were administered as conscious sedation during continuous monitoring of the patient's level of consciousness and physiological / cardiorespiratory status by the radiology RN, with a total moderate sedation time of 14 minutes. PROCEDURE: The procedure  risks, benefits, and alternatives were explained to the patient. Questions regarding the procedure were encouraged and answered. The patient understands and consents to the procedure. Patient placed prone. Select axial scans through the pelvis were obtained in the region of hypermetabolic mass seen on prior SPECT CT was localized. An appropriate skin entry site was marked. The operative field was prepped with chlorhexidinein a sterile fashion, and a sterile drape was applied covering the operative field. A sterile gown and sterile gloves were used for the procedure. Local anesthesia was provided  with 1% Lidocaine. Under CT fluoroscopic guidance, a 17 gauge trocar needle was advanced to the margin of the lesion. Once needle tip position was confirmed, coaxial 18-gauge core biopsy samples were obtained, submitted in formalin to surgical pathology. The guide needle was removed. Postprocedure scans show no hemorrhage or other apparent complication. COMPLICATIONS: None immediate FINDINGS: Calcifications corresponding to the presacral hypermetabolic focus on prior PET-CT were localized. Multiple regional representative core biopsy samples were obtained. IMPRESSION: 1. Technically successful CT-guided core biopsy, presacral mass. Electronically Signed   By: Lucrezia Europe M.D.   On: 06/24/2017 13:18    ASSESSMENT: History of rectal cancer now with unintentional weight loss.  PLAN:    1.  History of rectal cancer: Patient appears to have received neoadjuvant 5-FU and XRT in the spring 2014.  He subsequently underwent surgical resection at [facility] and then was lost to follow-up.  Patient reports that he did not receive adjuvant FOLFOX as recommended.  His tumor staging was T3, N2, M0 or stage III.  CEA is within normal limits.  PET scan results reviewed independently and report as above.  Despite the positivity on PET scan of his presacral mass, CT-guided biopsy was negative for malignancy.  No intervention is needed at this time.  Patient was given a referral to have a repeat colonoscopy in the near future.  Plan to repeat CT scan in 3 months to assess for any interval change.   2.  Unintentional weight loss: Biopsy negative for malignancy.  Patient's weight is now improving.   3.  Port: Patient had port placement greater than 5 years ago and states it has not been accessed in many years.  Patient was given a referral for port removal.    Approximately 30 minutes was spent in discussion of which greater than 50% was consultation.  Patient expressed understanding and was in agreement with this plan. He also  understands that He can call clinic at any time with any questions, concerns, or complaints.   Cancer Staging No matching staging information was found for the patient.  Lloyd Huger, MD   07/06/2017 8:55 AM

## 2017-07-04 ENCOUNTER — Inpatient Hospital Stay: Payer: Medicare Other | Attending: Oncology | Admitting: Oncology

## 2017-07-04 VITALS — BP 165/85 | HR 70 | Temp 97.5°F | Resp 18 | Wt 124.6 lb

## 2017-07-04 DIAGNOSIS — R634 Abnormal weight loss: Secondary | ICD-10-CM | POA: Diagnosis not present

## 2017-07-04 DIAGNOSIS — Z85048 Personal history of other malignant neoplasm of rectum, rectosigmoid junction, and anus: Secondary | ICD-10-CM | POA: Insufficient documentation

## 2017-07-04 DIAGNOSIS — C20 Malignant neoplasm of rectum: Secondary | ICD-10-CM

## 2017-07-04 DIAGNOSIS — I10 Essential (primary) hypertension: Secondary | ICD-10-CM | POA: Diagnosis not present

## 2017-07-04 DIAGNOSIS — E119 Type 2 diabetes mellitus without complications: Secondary | ICD-10-CM | POA: Diagnosis not present

## 2017-07-04 NOTE — Progress Notes (Signed)
Patient denies any concerns today.  

## 2017-07-23 ENCOUNTER — Encounter: Payer: Self-pay | Admitting: *Deleted

## 2017-07-29 ENCOUNTER — Ambulatory Visit: Payer: Medicare Other | Admitting: General Surgery

## 2017-08-21 ENCOUNTER — Encounter: Payer: Self-pay | Admitting: General Surgery

## 2017-08-21 ENCOUNTER — Ambulatory Visit (INDEPENDENT_AMBULATORY_CARE_PROVIDER_SITE_OTHER): Payer: Medicare Other | Admitting: General Surgery

## 2017-08-21 VITALS — BP 118/66 | HR 74 | Resp 12 | Ht 63.0 in | Wt 123.0 lb

## 2017-08-21 DIAGNOSIS — Z8639 Personal history of other endocrine, nutritional and metabolic disease: Secondary | ICD-10-CM

## 2017-08-21 DIAGNOSIS — Z85048 Personal history of other malignant neoplasm of rectum, rectosigmoid junction, and anus: Secondary | ICD-10-CM

## 2017-08-21 LAB — GLUCOSE, POCT (MANUAL RESULT ENTRY): POC Glucose: 97 mg/dl (ref 70–99)

## 2017-08-21 MED ORDER — POLYETHYLENE GLYCOL 3350 17 GM/SCOOP PO POWD: ORAL | 0 refills | Status: DC

## 2017-08-21 NOTE — Patient Instructions (Addendum)
The patient is aware to call back for any questions or concerns.  Colonoscopy, Adult A colonoscopy is an exam to look at the entire large intestine. During the exam, a lubricated, bendable tube is inserted into the anus and then passed into the rectum, colon, and other parts of the large intestine. A colonoscopy is often done as a part of normal colorectal screening or in response to certain symptoms, such as anemia, persistent diarrhea, abdominal pain, and blood in the stool. The exam can help screen for and diagnose medical problems, including:  Tumors.  Polyps.  Inflammation.  Areas of bleeding.  Tell a health care provider about:  Any allergies you have.  All medicines you are taking, including vitamins, herbs, eye drops, creams, and over-the-counter medicines.  Any problems you or family members have had with anesthetic medicines.  Any blood disorders you have.  Any surgeries you have had.  Any medical conditions you have.  Any problems you have had passing stool. What are the risks? Generally, this is a safe procedure. However, problems may occur, including:  Bleeding.  A tear in the intestine.  A reaction to medicines given during the exam.  Infection (rare).  What happens before the procedure? Eating and drinking restrictions Follow instructions from your health care provider about eating and drinking, which may include:  A few days before the procedure - follow a low-fiber diet. Avoid nuts, seeds, dried fruit, raw fruits, and vegetables.  1-3 days before the procedure - follow a clear liquid diet. Drink only clear liquids, such as clear broth or bouillon, black coffee or tea, clear juice, clear soft drinks or sports drinks, gelatin dessert, and popsicles. Avoid any liquids that contain red or purple dye.  On the day of the procedure - do not eat or drink anything during the 2 hours before the procedure, or within the time period that your health care provider  recommends.  Bowel prep If you were prescribed an oral bowel prep to clean out your colon:  Take it as told by your health care provider. Starting the day before your procedure, you will need to drink a large amount of medicated liquid. The liquid will cause you to have multiple loose stools until your stool is almost clear or light green.  If your skin or anus gets irritated from diarrhea, you may use these to relieve the irritation: ? Medicated wipes, such as adult wet wipes with aloe and vitamin E. ? A skin soothing-product like petroleum jelly.  If you vomit while drinking the bowel prep, take a break for up to 60 minutes and then begin the bowel prep again. If vomiting continues and you cannot take the bowel prep without vomiting, call your health care provider.  General instructions  Ask your health care provider about changing or stopping your regular medicines. This is especially important if you are taking diabetes medicines or blood thinners.  Plan to have someone take you home from the hospital or clinic. What happens during the procedure?  An IV tube may be inserted into one of your veins.  You will be given medicine to help you relax (sedative).  To reduce your risk of infection: ? Your health care team will wash or sanitize their hands. ? Your anal area will be washed with soap.  You will be asked to lie on your side with your knees bent.  Your health care provider will lubricate a long, thin, flexible tube. The tube will have a camera and   a light on the end.  The tube will be inserted into your anus.  The tube will be gently eased through your rectum and colon.  Air will be delivered into your colon to keep it open. You may feel some pressure or cramping.  The camera will be used to take images during the procedure.  A small tissue sample may be removed from your body to be examined under a microscope (biopsy). If any potential problems are found, the tissue  will be sent to a lab for testing.  If small polyps are found, your health care provider may remove them and have them checked for cancer cells.  The tube that was inserted into your anus will be slowly removed. The procedure may vary among health care providers and hospitals. What happens after the procedure?  Your blood pressure, heart rate, breathing rate, and blood oxygen level will be monitored until the medicines you were given have worn off.  Do not drive for 24 hours after the exam.  You may have a small amount of blood in your stool.  You may pass gas and have mild abdominal cramping or bloating due to the air that was used to inflate your colon during the exam.  It is up to you to get the results of your procedure. Ask your health care provider, or the department performing the procedure, when your results will be ready. This information is not intended to replace advice given to you by your health care provider. Make sure you discuss any questions you have with your health care provider. Document Released: 05/03/2000 Document Revised: 03/06/2016 Document Reviewed: 07/18/2015 Elsevier Interactive Patient Education  2018 Elsevier Inc.  

## 2017-08-21 NOTE — Progress Notes (Addendum)
Patient ID: John Shaw, male   DOB: Jul 09, 1940, 77 y.o.   MRN: [id number]  Chief Complaint  Patient presents with  . Procedure    HPI John Shaw is a 77 y.o. male.  Here for port removal and discuss colonoscopy referred by Dr Grayland Ormond. Last colonoscopy was 2016. He states since he has lost weight ( 50 pounds) he is not on any diabetic medication or blood pressure medication. He states his bowels are regular. He states his appetite is good, daughter helps with meals. He lives in [place] with the [employer]. He is here with his cousin, Deforest Hoyles POA.   HPI  Past Medical History:  Diagnosis Date  . Diabetes mellitus without complication ([place])   . Hypertension   . Rectal cancer ([place]) 2014    Past Surgical History:  Procedure Laterality Date  . COLONOSCOPY  05/31/2014  . ROBOT ASSISTED LAPAROSCOPIC PARTIAL COLECTOMY  2016   Dr Audie Clear    Family History  Problem Relation Age of Onset  . Colon cancer Neg Hx     Social History Social History   Tobacco Use  . Smoking status: Never Smoker  . Smokeless tobacco: Never Used  Substance Use Topics  . Alcohol use: No  . Drug use: No    No Known Allergies  Current Outpatient Medications  Medication Sig Dispense Refill  . Melatonin 1 MG CAPS Take by mouth at bedtime.    . polyethylene glycol powder (GLYCOLAX/MIRALAX) powder 255 grams one bottle for colonoscopy prep 255 g 0   No current facility-administered medications for this visit.     Review of Systems Review of Systems  Constitutional: Negative.   Respiratory: Negative.   Cardiovascular: Negative.     Blood pressure 118/66, pulse 74, resp. rate 12, height 5\' 3"  (1.6 m), weight 123 lb (55.8 kg), SpO2 98 %.  Physical Exam Physical Exam  Constitutional: He is oriented to person, place, and time. He appears well-developed and well-nourished.  HENT:  Mouth/Throat: Oropharynx is clear and moist.  Eyes: Conjunctivae are normal. No  scleral icterus.  Neck: Neck supple.  Cardiovascular: Normal rate, regular rhythm and normal heart sounds.  Pulmonary/Chest: Effort normal and breath sounds normal.    Abdominal: Soft. Normal appearance. There is no tenderness.  Lymphadenopathy:    He has no cervical adenopathy.  Neurological: He is alert and oriented to person, place, and time.  Skin: Skin is warm and dry.  Psychiatric: His behavior is normal.    Data Reviewed  Colonoscopy report dated May 31, 2014 treated for history of rectal cancer described an ulcer with polypoid edges of the colonic anastomosis.  Transverse colon polyps x3.  Descending colon polyp.  Pathology of the ascending and descending colon polyps were adenomatous without atypia.  Granulation tissue noted at the rectum with active proctitis.  No no CMV noted.  No granulomas.  Assessment    Candidate for follow-up colonoscopy based on for more polyps at the time of his last exam.  Would defer port removal until colon is clear.    Plan    Colonoscopy first then consider port removal.  Colonoscopy with possible biopsy/polypectomy prn: Information regarding the procedure, including its potential risks and complications (including but not limited to perforation of the bowel, which may require emergency surgery to repair, and bleeding) was verbally given to the patient. Educational information regarding lower intestinal endoscopy was given to the patient. Written instructions for how to complete the bowel prep using Miralax were  provided. The importance of drinking ample fluids to avoid dehydration as a result of the prep emphasized.  Blood glucose 97 today.     HPI, Physical Exam, Assessment and Plan have been scribed under the direction and in the presence of Robert Bellow, MD. Karie Fetch, RN  I have completed the exam and reviewed the above documentation for accuracy and completeness.  I agree with the above.  Haematologist has been used  and any errors in dictation or transcription are unintentional.  Hervey Ard, M.D., F.A.C.S.  Forest Gleason Ludmilla Mcgillis 08/22/2017, 2:03 PM  Patient has been scheduled for a colonoscopy on 09-03-17 at [facility]. Miralax prescription has been sent in to the patient's pharmacy today. Colonoscopy instructions have been reviewed with the patient. This patient is aware to call the office if they have further questions.   Dominga Ferry, CMA

## 2017-09-03 ENCOUNTER — Encounter: Payer: Self-pay | Admitting: *Deleted

## 2017-09-03 ENCOUNTER — Encounter
Admission: RE | Disposition: A | Discharge status: Home / Self Care | Payer: Self-pay | Source: Ambulatory Visit | Attending: General Surgery

## 2017-09-03 ENCOUNTER — Ambulatory Visit: Payer: Medicare Other | Admitting: Anesthesiology

## 2017-09-03 ENCOUNTER — Ambulatory Visit
Admission: RE | Admit: 2017-09-03 | Discharge: 2017-09-03 | Disposition: A | Discharge status: Home / Self Care | Payer: Medicare Other | Source: Ambulatory Visit | Attending: General Surgery | Admitting: General Surgery

## 2017-09-03 DIAGNOSIS — K5730 Diverticulosis of large intestine without perforation or abscess without bleeding: Secondary | ICD-10-CM | POA: Diagnosis not present

## 2017-09-03 DIAGNOSIS — Z8639 Personal history of other endocrine, nutritional and metabolic disease: Secondary | ICD-10-CM

## 2017-09-03 DIAGNOSIS — Z85038 Personal history of other malignant neoplasm of large intestine: Secondary | ICD-10-CM | POA: Insufficient documentation

## 2017-09-03 DIAGNOSIS — Z79899 Other long term (current) drug therapy: Secondary | ICD-10-CM | POA: Insufficient documentation

## 2017-09-03 DIAGNOSIS — Z7984 Long term (current) use of oral hypoglycemic drugs: Secondary | ICD-10-CM | POA: Diagnosis not present

## 2017-09-03 DIAGNOSIS — Z1211 Encounter for screening for malignant neoplasm of colon: Secondary | ICD-10-CM | POA: Insufficient documentation

## 2017-09-03 DIAGNOSIS — E119 Type 2 diabetes mellitus without complications: Secondary | ICD-10-CM | POA: Diagnosis not present

## 2017-09-03 DIAGNOSIS — I10 Essential (primary) hypertension: Secondary | ICD-10-CM | POA: Diagnosis not present

## 2017-09-03 DIAGNOSIS — Z85048 Personal history of other malignant neoplasm of rectum, rectosigmoid junction, and anus: Secondary | ICD-10-CM

## 2017-09-03 HISTORY — PX: COLONOSCOPY WITH PROPOFOL: SHX5780

## 2017-09-03 SURGERY — COLONOSCOPY WITH PROPOFOL
Anesthesia: General

## 2017-09-03 MED ORDER — PROPOFOL 10 MG/ML IV BOLUS
INTRAVENOUS | Status: DC | PRN
  Administered 2017-09-03: 80 mg via INTRAVENOUS

## 2017-09-03 MED ORDER — PROPOFOL 500 MG/50ML IV EMUL
INTRAVENOUS | Status: DC | PRN
  Administered 2017-09-03: 80 ug/kg/min via INTRAVENOUS

## 2017-09-03 MED ORDER — EPHEDRINE SULFATE-NACL 50-0.9 MG/10ML-% IV SOSY
PREFILLED_SYRINGE | INTRAVENOUS | Status: DC | PRN
Start: 2017-09-03 — End: 2017-09-03
  Administered 2017-09-03: 10 mg via INTRAVENOUS

## 2017-09-03 MED ORDER — SODIUM CHLORIDE 0.9 % IV SOLN
INTRAVENOUS | Status: DC
  Administered 2017-09-03: 1000 mL via INTRAVENOUS

## 2017-09-03 MED ORDER — PROPOFOL 10 MG/ML IV BOLUS
INTRAVENOUS | Status: AC
  Filled 2017-09-03: qty 20

## 2017-09-03 NOTE — Transfer of Care (Signed)
Immediate Anesthesia Transfer of Care Note  Patient: John Shaw  Procedure(s) Performed: COLONOSCOPY WITH PROPOFOL (N/A )  Patient Location: PACU and Endoscopy Unit  Anesthesia Type:General  Level of Consciousness: sedated and responds to stimulation  Airway & Oxygen Therapy: Patient Spontanous Breathing and Patient connected to nasal cannula oxygen  Post-op Assessment: Vital signs stable, report to RN  Post vital signs: Reviewed and stable  Last Vitals:  Vitals Value Taken Time  BP 107/48 09/03/2017 11:10 AM  Temp 36.2 C 09/03/2017 11:10 AM  Pulse 72 09/03/2017 11:19 AM  Resp 14 09/03/2017 11:19 AM  SpO2 100 % 09/03/2017 11:19 AM  Vitals shown include unvalidated device data.  Last Pain:  Vitals:   09/03/17 1110  TempSrc: Tympanic  PainSc:          Complications: No apparent anesthesia complications

## 2017-09-03 NOTE — Op Note (Signed)
[facility] Gastroenterology Patient Name: John Shaw Procedure Date: 09/03/2017 10:30 AM MRN: [id number] Account #: [id number] Date of Birth: June 06, 1940 Admit Type: Outpatient Age: 77 Room: [facility] ENDO ROOM 1 Gender: Male Note Status: Finalized Procedure:            Colonoscopy Indications:          High risk colon cancer surveillance: Personal history                        of colon cancer Providers:            Robert Bellow, MD Referring MD:         Leona Carry. Hall Busing, MD (Referring MD) Medicines:            Monitored Anesthesia Care Complications:        No immediate complications. Procedure:            Pre-Anesthesia Assessment:                       - Prior to the procedure, a History and Physical was                        performed, and patient medications, allergies and                        sensitivities were reviewed. The patient's tolerance of                        previous anesthesia was reviewed.                       - The risks and benefits of the procedure and the                        sedation options and risks were discussed with the                        patient. All questions were answered and informed                        consent was obtained.                       After obtaining informed consent, the colonoscope was                        passed under direct vision. Throughout the procedure,                        the patient's blood pressure, pulse, and oxygen                        saturations were monitored continuously. The                        Colonoscope was introduced through the anus and                        advanced to the the cecum, identified by appendiceal  orifice and ileocecal valve. The colonoscopy was                        performed without difficulty. The patient tolerated the                        procedure well. The quality of the bowel preparation                        was adequate  to identify polyps. Findings:      A few large-mouthed diverticula were found in the sigmoid colon.      The retroflexed view of the distal rectum and anal verge was normal and       showed no anal or rectal abnormalities. Impression:           - Diverticulosis in the sigmoid colon.                       - The distal rectum and anal verge are normal on                        retroflexion view.                       - No specimens collected. Recommendation:       - Repeat colonoscopy in 5 years for surveillance. Procedure Code(s):    --- Professional ---                       [phone], Colonoscopy, flexible; diagnostic, including                        collection of specimen(s) by brushing or washing, when                        performed (separate procedure) Diagnosis Code(s):    --- Professional ---                       B71.696, Personal history of other malignant neoplasm                        of large intestine                       K57.30, Diverticulosis of large intestine without                        perforation or abscess without bleeding CPT copyright 2017 American Medical Association. All rights reserved. The codes documented in this report are preliminary and upon coder review may  be revised to meet current compliance requirements. Robert Bellow, MD 09/03/2017 11:15:16 AM This report has been signed electronically. Number of Addenda: 0 Note Initiated On: 09/03/2017 10:30 AM Scope Withdrawal Time: 0 hours 15 minutes 11 seconds  Total Procedure Duration: 0 hours 37 minutes 34 seconds       [facility]

## 2017-09-03 NOTE — Anesthesia Preprocedure Evaluation (Signed)
Anesthesia Evaluation  Patient identified by MRN, date of birth, ID band Patient awake    Reviewed: Allergy & Precautions, NPO status , Patient's Chart, lab work & pertinent test results  History of Anesthesia Complications Negative for: history of anesthetic complications  Airway Mallampati: II  TM Distance: >3 FB Neck ROM: Full    Dental  (+) Loose, Poor Dentition   Pulmonary neg pulmonary ROS, neg sleep apnea, neg COPD,    breath sounds clear to auscultation- rhonchi (-) wheezing      Cardiovascular hypertension, (-) CAD, (-) Past MI, (-) Cardiac Stents and (-) CABG  Rhythm:Regular Rate:Normal - Systolic murmurs and - Diastolic murmurs    Neuro/Psych negative neurological ROS  negative psych ROS   GI/Hepatic negative GI ROS, Neg liver ROS,   Endo/Other  negative endocrine ROSneg diabetes  Renal/GU negative Renal ROS     Musculoskeletal negative musculoskeletal ROS (+)   Abdominal (+) - obese,   Peds  Hematology negative hematology ROS (+)   Anesthesia Other Findings Past Medical History: No date: Diabetes mellitus without complication (HCC) No date: Hypertension 2014: Rectal cancer ([place])   Reproductive/Obstetrics                             Anesthesia Physical Anesthesia Plan  ASA: II  Anesthesia Plan: General   Post-op Pain Management:    Induction: Intravenous  PONV Risk Score and Plan: 1 and Propofol infusion  Airway Management Planned: Natural Airway  Additional Equipment:   Intra-op Plan:   Post-operative Plan:   Informed Consent: I have reviewed the patients History and Physical, chart, labs and discussed the procedure including the risks, benefits and alternatives for the proposed anesthesia with the patient or authorized representative who has indicated his/her understanding and acceptance.   Dental advisory given  Plan Discussed with: CRNA and  Anesthesiologist  Anesthesia Plan Comments:         Anesthesia Quick Evaluation

## 2017-09-03 NOTE — Anesthesia Post-op Follow-up Note (Signed)
Anesthesia QCDR form completed.        

## 2017-09-03 NOTE — H&P (Signed)
No interval change in clinical exam or history. Tolerated prep well. For colonoscopy.

## 2017-09-03 NOTE — Anesthesia Postprocedure Evaluation (Signed)
Anesthesia Post Note  Patient: Howard Bunte  Procedure(s) Performed: COLONOSCOPY WITH PROPOFOL (N/A )  Patient location during evaluation: Endoscopy Anesthesia Type: General Level of consciousness: awake and alert and oriented Pain management: pain level controlled Vital Signs Assessment: post-procedure vital signs reviewed and stable Respiratory status: spontaneous breathing, nonlabored ventilation and respiratory function stable Cardiovascular status: blood pressure returned to baseline and stable Postop Assessment: no signs of nausea or vomiting Anesthetic complications: no     Last Vitals:  Vitals:   09/03/17 1130 09/03/17 1140  BP: (!) 126/47 (!) 134/50  Pulse: 63 61  Resp: 16 16  Temp:    SpO2: 100% 100%    Last Pain:  Vitals:   09/03/17 1110  TempSrc: Tympanic  PainSc:                  Stanton Kissoon

## 2017-09-08 ENCOUNTER — Telehealth: Payer: Self-pay | Admitting: *Deleted

## 2017-09-08 NOTE — Telephone Encounter (Signed)
-----   Message from Robert Bellow, MD sent at 09/04/2017  6:36 PM EDT ----- OK to schedule port removal.  ----- Message ----- From: Dominga Ferry, CMA Sent: 09/04/2017   1:34 PM To: Robert Bellow, MD  Patient had colonoscopy completed on 09-03-17. Is it okay to arrange for port removal or do we need to wait? Thanks.

## 2017-09-08 NOTE — Telephone Encounter (Signed)
Message left for patient's POA, Berlinda Last, to call the office back.   We can arrange for patient's port removal when convenient. POA will need to be present to sign consent for the patient.

## 2017-09-08 NOTE — Telephone Encounter (Signed)
Patient's POA called the office back and scheduled an appointment for port removal for 10-07-17 at 10:45 am.

## 2017-09-29 ENCOUNTER — Ambulatory Visit
Admission: RE | Admit: 2017-09-29 | Discharge: 2017-09-29 | Disposition: A | Discharge status: Home / Self Care | Payer: Medicare Other | Source: Ambulatory Visit | Attending: Oncology | Admitting: Oncology

## 2017-09-29 ENCOUNTER — Inpatient Hospital Stay: Payer: Medicare Other | Attending: Oncology

## 2017-09-29 DIAGNOSIS — Z85048 Personal history of other malignant neoplasm of rectum, rectosigmoid junction, and anus: Secondary | ICD-10-CM | POA: Insufficient documentation

## 2017-09-29 DIAGNOSIS — I700 Atherosclerosis of aorta: Secondary | ICD-10-CM | POA: Diagnosis not present

## 2017-09-29 DIAGNOSIS — K760 Fatty (change of) liver, not elsewhere classified: Secondary | ICD-10-CM | POA: Diagnosis not present

## 2017-09-29 DIAGNOSIS — K8020 Calculus of gallbladder without cholecystitis without obstruction: Secondary | ICD-10-CM | POA: Diagnosis not present

## 2017-09-29 DIAGNOSIS — N289 Disorder of kidney and ureter, unspecified: Secondary | ICD-10-CM | POA: Diagnosis not present

## 2017-09-29 DIAGNOSIS — C20 Malignant neoplasm of rectum: Secondary | ICD-10-CM | POA: Insufficient documentation

## 2017-09-29 LAB — COMPREHENSIVE METABOLIC PANEL
ALBUMIN: 4.3 g/dL (ref 3.5–5.0)
ALK PHOS: 91 U/L (ref 38–126)
ALT: 10 U/L — ABNORMAL LOW (ref 17–63)
ANION GAP: 9 (ref 5–15)
AST: 19 U/L (ref 15–41)
BILIRUBIN TOTAL: 0.9 mg/dL (ref 0.3–1.2)
BUN: 21 mg/dL — AB (ref 6–20)
CALCIUM: 8.9 mg/dL (ref 8.9–10.3)
CO2: 24 mmol/L (ref 22–32)
Chloride: 103 mmol/L (ref 101–111)
Creatinine, Ser: 1.08 mg/dL (ref 0.61–1.24)
GFR calc Af Amer: >60 mL/min (ref >60)
GFR calc non Af Amer: >60 mL/min (ref >60)
GLUCOSE: 99 mg/dL (ref 65–99)
Potassium: 4.1 mmol/L (ref 3.5–5.1)
Sodium: 136 mmol/L (ref 135–145)
Total Protein: 8.3 g/dL — ABNORMAL HIGH (ref 6.5–8.1)

## 2017-09-29 LAB — CBC WITH DIFFERENTIAL/PLATELET
BASOS ABS: 0 10*3/uL (ref 0–0.1)
Basophils Relative: 1 %
EOS PCT: 1 %
Eosinophils Absolute: 0.1 10*3/uL (ref 0–0.7)
HCT: 41.4 % (ref 40.0–52.0)
Hemoglobin: 14.1 g/dL (ref 13.0–18.0)
Lymphocytes Relative: 23 %
Lymphs Abs: 1.2 10*3/uL (ref 1.0–3.6)
MCH: 30.9 pg (ref 26.0–34.0)
MCHC: 34.1 g/dL (ref 32.0–36.0)
MCV: 90.5 fL (ref 80.0–100.0)
MONO ABS: 0.4 10*3/uL (ref 0.2–1.0)
Monocytes Relative: 7 %
NEUTROS ABS: 3.7 10*3/uL (ref 1.4–6.5)
Neutrophils Relative %: 68 %
PLATELETS: 193 10*3/uL (ref 150–440)
RBC: 4.57 MIL/uL (ref 4.40–5.90)
RDW: 14.3 % (ref 11.5–14.5)
WBC: 5.4 10*3/uL (ref 3.8–10.6)

## 2017-09-29 MED ORDER — IOPAMIDOL (ISOVUE-300) INJECTION 61%
100.0000 mL | Freq: Once | INTRAVENOUS | Status: AC | PRN
  Administered 2017-09-29: 100 mL via INTRAVENOUS

## 2017-09-30 LAB — CEA: CEA1: 1.6 ng/mL (ref 0.0–4.7)

## 2017-09-30 NOTE — Progress Notes (Signed)
[place]  Telephone:([phone]) [phone] Fax:(336) [phone]  ID: John Shaw OB: 04-25-1941  MR#: [id number]  LKG#:[id number]  Patient Care Team: Albina Billet, MD as PCP - General (Unknown Physician Specialty) Bary Castilla Forest Gleason, MD (General Surgery)  CHIEF COMPLAINT: History of rectal cancer  INTERVAL HISTORY: Patient returns to clinic today for further evaluation and discussion of his imaging results.  He continues to have intermittent diarrhea, but otherwise feels well and is asymptomatic.  He has a fair appetite and his weight has remained stable. He denies any pain.  He has no neurologic complaints.  He denies any recent fevers or illnesses.  He has no chest pain, shortness of breath, cough, or hemoptysis.  He denies any nausea, vomiting, or constipation.  He denies any melena or hematochezia.  He has no urinary complaints.  Patient offers no further specific complaints today.  REVIEW OF SYSTEMS:   Review of Systems  Constitutional: Negative.  Negative for fever, malaise/fatigue and weight loss.  Respiratory: Negative.  Negative for cough, hemoptysis and shortness of breath.   Cardiovascular: Negative.  Negative for chest pain and leg swelling.  Gastrointestinal: Positive for diarrhea. Negative for abdominal pain, blood in stool, constipation and melena.  Genitourinary: Negative.  Negative for dysuria.  Musculoskeletal: Negative.   Skin: Negative.  Negative for rash.  Neurological: Negative.  Negative for sensory change and weakness.  Psychiatric/Behavioral: Negative.  The patient is not nervous/anxious.     As per HPI. Otherwise, a complete review of systems is negative.  PAST MEDICAL HISTORY: Past Medical History:  Diagnosis Date  . Diabetes mellitus without complication ([place])   . Hypertension   . Rectal cancer ([place]) 2014    PAST SURGICAL HISTORY: Past Surgical History:  Procedure Laterality Date  . COLONOSCOPY  05/31/2014  . COLONOSCOPY WITH  PROPOFOL N/A 09/03/2017   Procedure: COLONOSCOPY WITH PROPOFOL;  Surgeon: Robert Bellow, MD;  Location: [facility] ENDOSCOPY;  Service: Endoscopy;  Laterality: N/A;  . ROBOT ASSISTED LAPAROSCOPIC PARTIAL COLECTOMY  2016   Dr Audie Clear    FAMILY HISTORY: Family History  Problem Relation Age of Onset  . Colon cancer Neg Hx     ADVANCED DIRECTIVES (Y/N):  N  HEALTH MAINTENANCE: Social History   Tobacco Use  . Smoking status: Never Smoker  . Smokeless tobacco: Never Used  Substance Use Topics  . Alcohol use: No  . Drug use: No     Colonoscopy:  PAP:  Bone density:  Lipid panel:  No Known Allergies  Current Outpatient Medications  Medication Sig Dispense Refill  . Melatonin 1 MG CAPS Take by mouth at bedtime.      No current facility-administered medications for this visit.     OBJECTIVE: Vitals:   10/03/17 1422  BP: (!) 151/72  Pulse: 78  Resp: 18  Temp: 98 F (36.7 C)     Body mass index is 21.69 kg/m.    ECOG FS:0 - Asymptomatic  General: Well-developed, well-nourished, no acute distress. Eyes: Pink conjunctiva, anicteric sclera. Lungs: Clear to auscultation bilaterally. Heart: Regular rate and rhythm. No rubs, murmurs, or gallops. Abdomen: Soft, nontender, nondistended. No organomegaly noted, normoactive bowel sounds. Musculoskeletal: No edema, cyanosis, or clubbing. Neuro: Alert, answering all questions appropriately. Cranial nerves grossly intact. Skin: No rashes or petechiae noted. Psych: Normal affect.  LAB RESULTS:  Lab Results  Component Value Date   NA 136 09/29/2017   K 4.1 09/29/2017   CL 103 09/29/2017   CO2 24 09/29/2017  GLUCOSE 99 09/29/2017   BUN 21 (H) 09/29/2017   CREATININE 1.08 09/29/2017   CALCIUM 8.9 09/29/2017   PROT 8.3 (H) 09/29/2017   ALBUMIN 4.3 09/29/2017   AST 19 09/29/2017   ALT 10 (L) 09/29/2017   ALKPHOS 91 09/29/2017   BILITOT 0.9 09/29/2017   GFRNONAA >60 09/29/2017   GFRAA >60 09/29/2017    Lab Results    Component Value Date   WBC 5.4 09/29/2017   NEUTROABS 3.7 09/29/2017   HGB 14.1 09/29/2017   HCT 41.4 09/29/2017   MCV 90.5 09/29/2017   PLT 193 09/29/2017     STUDIES: Ct Abdomen Pelvis W Contrast  Result Date: 09/29/2017 CLINICAL DATA:  Rectal cancer.  Colon resection.  Weight loss. EXAM: CT ABDOMEN AND PELVIS WITH CONTRAST TECHNIQUE: Multidetector CT imaging of the abdomen and pelvis was performed using the standard protocol following bolus administration of intravenous contrast. CONTRAST:  144mL ISOVUE-300 IOPAMIDOL (ISOVUE-300) INJECTION 61% COMPARISON:  PET 06/12/2017 and CT abdomen pelvis 05/19/2017. FINDINGS: Lower chest: Scarring in the lung bases. Heart is at the upper limits of normal in size. Left ventricle appears dilated. No pericardial or pleural effusion. Distal esophagus is grossly unremarkable. Hepatobiliary: Liver appears decreased in attenuation. Liver is otherwise unremarkable. Numerous stones layer in the gallbladder. No biliary ductal dilatation. Pancreas: 9 mm low-attenuation lesion in the body of the pancreas likely unchanged from 12/23/2009. No ductal dilatation or gland atrophy. Spleen: Negative. Adrenals/Urinary Tract: Adrenal glands are unremarkable. Renal cortical thinning bilaterally. Low-attenuation lesions in the kidneys are subcentimeter in size and too small to characterize. Hyperattenuating 11 mm lesion is seen in the periphery of the interpolar left kidney, unchanged in size from 05/19/2017 but not well seen on 01/11/2013. Lesion was hyperdense on 06/12/2017. Ureters are decompressed. Bladder is grossly unremarkable. Stomach/Bowel: Stomach, small bowel, appendix and majority of the colon are unremarkable. Fair amount of stool is seen in the transverse, descending and rectosigmoid colon. Postoperative changes in the rectosigmoid colon. A collection of air, measuring 1.9 cm, is now seen posterior to the rectosigmoid colon (series 2, image 68) at the site of  previously seen soft tissue on 06/12/2017. There may be a small amount of associated fluid (images 66-68). Presacral soft tissue thickening, as before. Vascular/Lymphatic: Atherosclerotic calcification of the arterial vasculature without abdominal aortic aneurysm. No pathologically enlarged lymph nodes. Reproductive: Prostate is visualized. Other: No free fluid.  Mesenteries and peritoneum are unremarkable. Musculoskeletal: Degenerative changes in the spine. No worrisome lytic or sclerotic lesions. IMPRESSION: 1. Focal collection of air and probable fluid posterior to the rectosigmoid anastomosis, at the site of previously seen hypermetabolic soft tissue on 84/69/6295. Findings may be due to chronic infection/abscess. Disease recurrence not excluded. Please correlate clinically. 2. No additional evidence of recurrent or metastatic disease. 3. Hyperattenuating lesion in the left kidney is likely stable from 05/19/2017 but not well-visualized on 01/11/2013. While a hyperdense cyst can have this appearance, definitive characterization is limited due to size and lack of precontrast imaging. If further evaluation is desired, MR abdomen without and with contrast is recommended. 4. Hepatic steatosis. 5. Cholelithiasis. 6.  Aortic atherosclerosis (ICD10-170.0). Electronically Signed   By: Lorin Picket M.D.   On: 09/29/2017 11:48    ASSESSMENT: History of rectal cancer now with unintentional weight loss.  PLAN:    1.  History of rectal cancer: Patient received neoadjuvant 5-FU and XRT in the Spring 2014.  He subsequently underwent surgical resection at [facility] and then was lost to follow-up.  Patient  reports that he did not receive adjuvant FOLFOX as recommended.  His tumor staging was T3, N2, M0 or stage III.  Recent colonoscopy is reported as within normal limits.  CT scan results from Sep 29, 2017 reviewed independently and report as above with no evidence of recurrent or metastatic disease.  Patient's CEA continues  to be within normal limits.  No intervention is needed at this time.  Patient is now greater than 5 years removed from his diagnosis without evidence of disease.  He can now be discharged from clinic.  Please refer patient back if there is any questions or concerns.   2.  Unintentional weight loss: Resolved.  Patient's weight is stable. 3.  Port: Patient has a port removal scheduled for the next several weeks.  Discharge from clinic as above.  Approximately 20 minutes was spent in discussion of which greater than 50% was consultation.  Patient expressed understanding and was in agreement with this plan. He also understands that He can call clinic at any time with any questions, concerns, or complaints.   Cancer Staging No matching staging information was found for the patient.  John Huger, MD   10/04/2017 11:33 AM

## 2017-10-03 ENCOUNTER — Inpatient Hospital Stay (HOSPITAL_BASED_OUTPATIENT_CLINIC_OR_DEPARTMENT_OTHER): Payer: Medicare Other | Admitting: Oncology

## 2017-10-03 ENCOUNTER — Other Ambulatory Visit: Payer: Self-pay

## 2017-10-03 VITALS — BP 151/72 | HR 78 | Temp 98.0°F | Resp 18 | Wt 122.5 lb

## 2017-10-03 DIAGNOSIS — Z85048 Personal history of other malignant neoplasm of rectum, rectosigmoid junction, and anus: Secondary | ICD-10-CM | POA: Diagnosis not present

## 2017-10-03 DIAGNOSIS — C20 Malignant neoplasm of rectum: Secondary | ICD-10-CM

## 2017-10-03 NOTE — Progress Notes (Signed)
Here for follow up. Stated no pain c/o having one BM per day. Pt here w care giver " Im doing just fine " he stated.

## 2017-10-07 ENCOUNTER — Ambulatory Visit (INDEPENDENT_AMBULATORY_CARE_PROVIDER_SITE_OTHER): Payer: Medicare Other | Admitting: General Surgery

## 2017-10-07 ENCOUNTER — Encounter: Payer: Self-pay | Admitting: General Surgery

## 2017-10-07 VITALS — BP 152/74 | HR 62 | Resp 12 | Ht 63.0 in | Wt 123.0 lb

## 2017-10-07 DIAGNOSIS — Z85048 Personal history of other malignant neoplasm of rectum, rectosigmoid junction, and anus: Secondary | ICD-10-CM

## 2017-10-07 NOTE — Progress Notes (Signed)
Patient ID: John Shaw, male   DOB: Feb 24, 1941, 77 y.o.   MRN: [id number]  Chief Complaint  Patient presents with  . Procedure    HPI [name] is a 77 y.o. male.  Here for port removal.  HPI  Past Medical History:  Diagnosis Date  . Diabetes mellitus without complication ([place])   . Hypertension   . Rectal cancer ([place]) 2014    Past Surgical History:  Procedure Laterality Date  . COLONOSCOPY  05/31/2014  . COLONOSCOPY WITH PROPOFOL N/A 09/03/2017   Procedure: COLONOSCOPY WITH PROPOFOL;  Surgeon: Robert Bellow, MD;  Location: [facility] ENDOSCOPY;  Service: Endoscopy;  Laterality: N/A;  . ROBOT ASSISTED LAPAROSCOPIC PARTIAL COLECTOMY  2016   Dr Audie Clear    Family History  Problem Relation Age of Onset  . Colon cancer Neg Hx     Social History Social History   Tobacco Use  . Smoking status: Never Smoker  . Smokeless tobacco: Never Used  Substance Use Topics  . Alcohol use: No  . Drug use: No    No Known Allergies  Current Outpatient Medications  Medication Sig Dispense Refill  . Melatonin 1 MG CAPS Take by mouth at bedtime.      No current facility-administered medications for this visit.     Review of Systems Review of Systems  Constitutional: Negative.   Respiratory: Negative.   Cardiovascular: Negative.     Blood pressure (!) 152/74, pulse 62, resp. rate 12, height 5\' 3"  (1.6 m), weight 123 lb (55.8 kg), SpO2 98 %.  Physical Exam Physical Exam  Constitutional: He is oriented to person, place, and time. He appears well-developed and well-nourished.  Pulmonary/Chest:    Neurological: He is alert and oriented to person, place, and time.  Skin: Skin is warm and dry.  Psychiatric: His behavior is normal.    Data Reviewed The procedure was reviewed with his patient, and his guardian was amenable to having it completed.  The area was cleansed with alcohol and 10 cc of 0.5% Xylocaine with 0.25% Marcaine with 1 to 200,000 units of  epinephrine was utilized and well-tolerated.  The previous incision was opened.  The port was exposed and transfixing sutures of Prolene removed.  The port was easily extracted with an intact tip.  The capsule around the port was imbricated with a running 3-0 Vicryl suture.  The skin was closed with a running 3-0 Vicryl subcuticular suture.  Bone, Steri-Strips followed by Telfa and Tegaderm dressing applied.  The patient tolerated the procedure well and was given an ice pack for comfort.  Assessment    Good tolerance of PowerPort removal.    Plan    Short nurse visit in one week Follow up as scheduled 2024 for repeat colonoscopy      HPI, Physical Exam, Assessment and Plan have been scribed under the direction and in the presence of Robert Bellow, MD. Karie Fetch, RN  I have completed the exam and reviewed the above documentation for accuracy and completeness.  I agree with the above.  Haematologist has been used and any errors in dictation or transcription are unintentional.  Hervey Ard, M.D., F.A.C.S.   Forest Gleason Rori Goar 10/07/2017, 8:50 PM

## 2017-10-07 NOTE — Patient Instructions (Addendum)
Follow up with the nurse in one week May shower May remove dressing in 2-3 days Steri strips will gradually come off over 2-3 weeks May use an Ice pack as needed for comfort

## 2017-10-15 ENCOUNTER — Ambulatory Visit (INDEPENDENT_AMBULATORY_CARE_PROVIDER_SITE_OTHER): Payer: Medicare Other | Admitting: *Deleted

## 2017-10-15 DIAGNOSIS — Z85048 Personal history of other malignant neoplasm of rectum, rectosigmoid junction, and anus: Secondary | ICD-10-CM

## 2017-10-15 NOTE — Progress Notes (Signed)
Patient came in today for a wound check.  The wound is clean, with no signs of infection noted. .Follow up as scheduled.  

## 2017-10-15 NOTE — Patient Instructions (Signed)
Return as scheduled 

## 2018-05-16 IMAGING — CT CT BIOPSY
2 series · 10 of 14 positions shown, 12 images · non-contrast
Comparison: none

CLINICAL DATA: History of rectal carcinoma. Recent PET-CT
demonstrates hypermetabolic presacral mass.

[Series 2: i-spiral 5.0 b30f · axial · 0.77mm/px · z∈[-120,-32]mm · 6 of 35 slices shown, 8 images]
[im 5/35  soft-tissue]
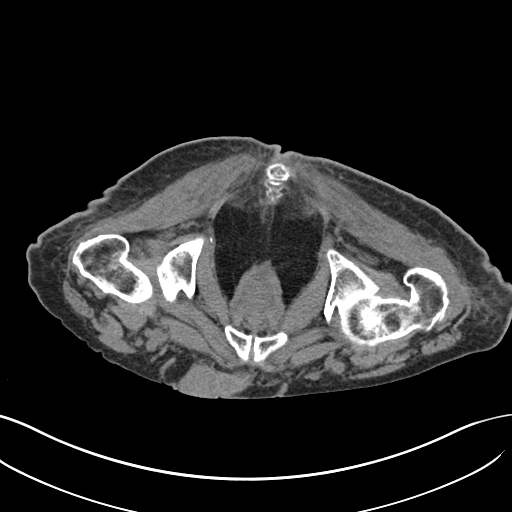
[im 5/35  bone]
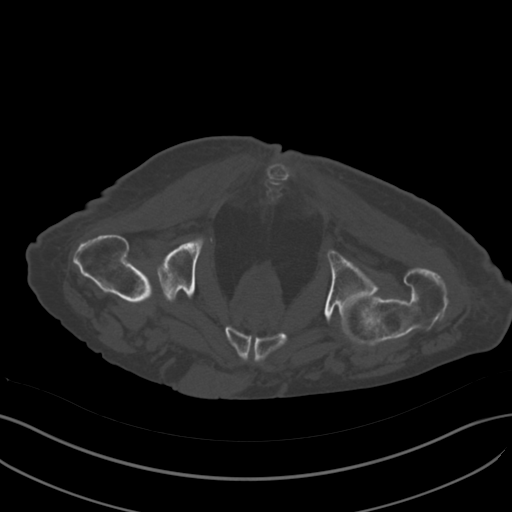
[im 10/35  bone]
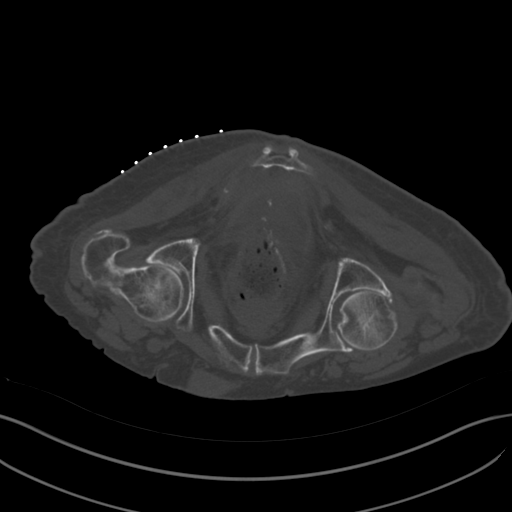
[im 15/35  bone]
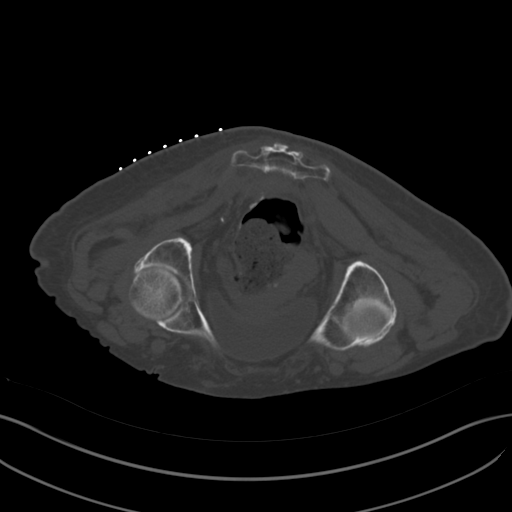
[im 20/35  bone]
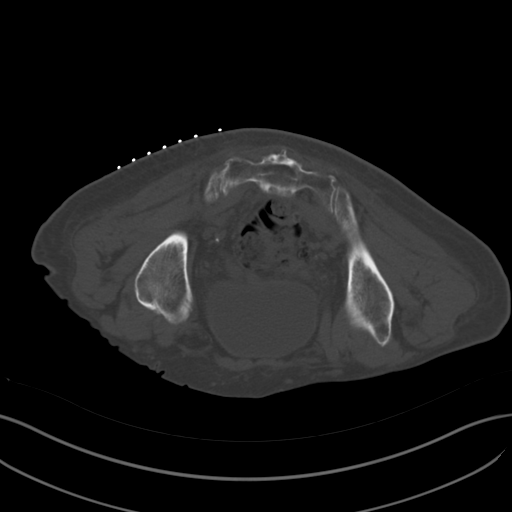
[im 25/35  soft-tissue]
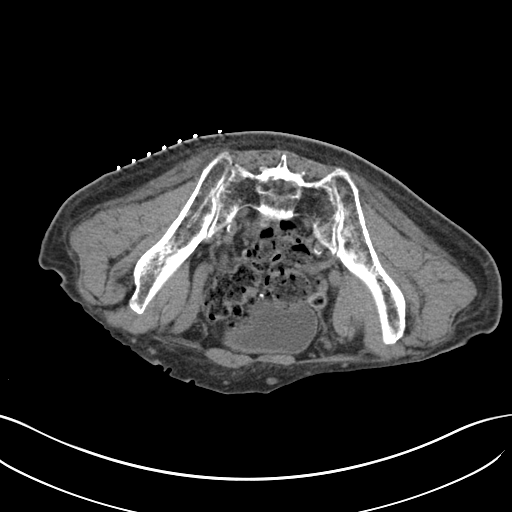
[im 25/35  bone]
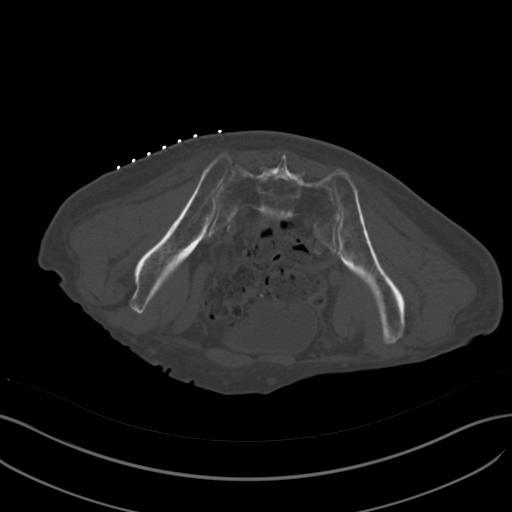
[im 30/35  bone]
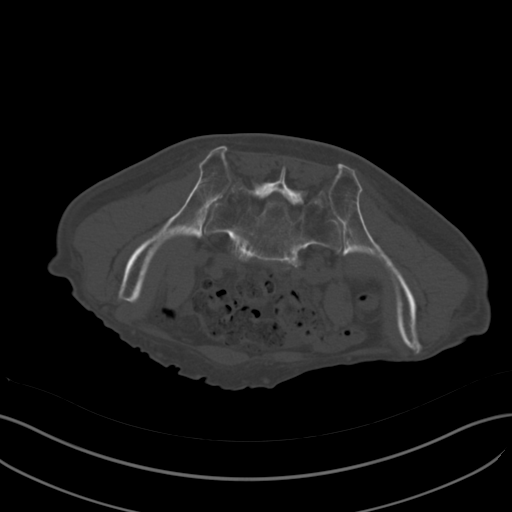

[Series 3: i-sequence 4.8 b30s · axial · 0.77mm/px · z∈[-107,-98]mm · 4 of 24 slices shown]
[im 5/24  bone]
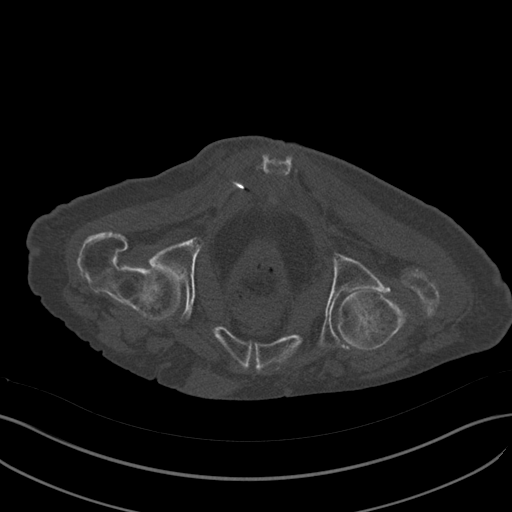
[im 10/24  bone]
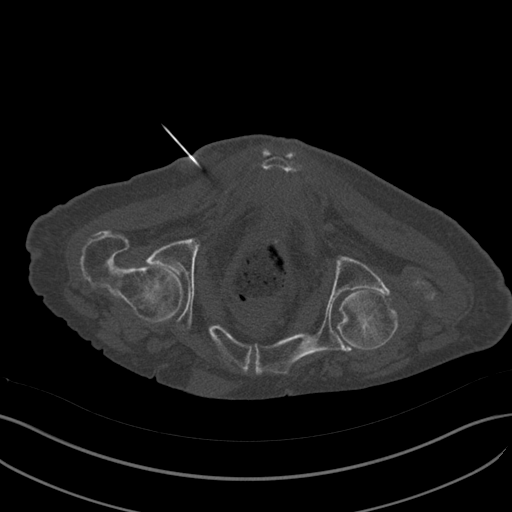
[im 14/24  bone]
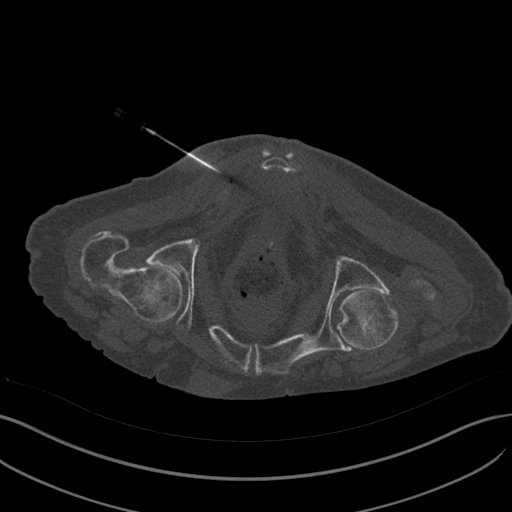
[im 19/24  bone]
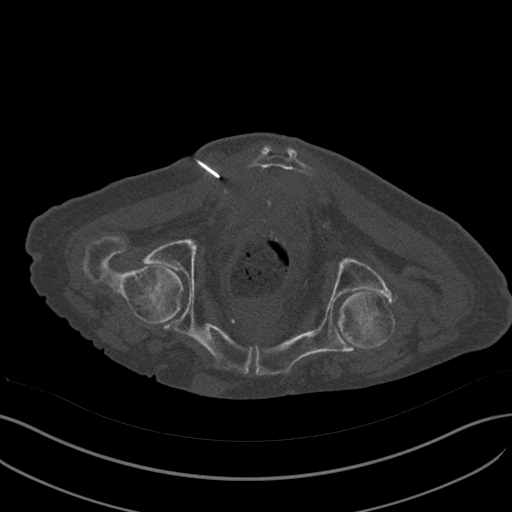

[10 of 14 positions shown; findings below may reference images not displayed]

EXAM:
CT GUIDED CORE BIOPSY OF PRESACRAL MASS

ANESTHESIA/SEDATION:
Intravenous Fentanyl and Versed were administered as conscious
sedation during continuous monitoring of the patient's level of
consciousness and physiological / cardiorespiratory status by the
radiology RN, with a total moderate sedation time of 14 minutes.

PROCEDURE:
The procedure risks, benefits, and alternatives were explained to
the patient. Questions regarding the procedure were encouraged and
answered. The patient understands and consents to the procedure.

Patient placed prone. Select axial scans through the pelvis were
obtained in the region of hypermetabolic mass seen on prior SPECT CT
was localized. An appropriate skin entry site was marked.

The operative field was prepped with chlorhexidinein a sterile
fashion, and a sterile drape was applied covering the operative
field. A sterile gown and sterile gloves were used for the
procedure. Local anesthesia was provided with 1% Lidocaine.

Under CT fluoroscopic guidance, a 17 gauge trocar needle was
advanced to the margin of the lesion. Once needle tip position was
confirmed, coaxial 18-gauge core biopsy samples were obtained,
submitted in formalin to surgical pathology. The guide needle was
removed. Postprocedure scans show no hemorrhage or other apparent
complication.

COMPLICATIONS:
None immediate
FINDINGS: Calcifications corresponding to the presacral hypermetabolic focus
on prior PET-CT were localized. Multiple regional representative
core biopsy samples were obtained.
IMPRESSION: 1. Technically successful CT-guided core biopsy, presacral mass.

## 2019-10-27 ENCOUNTER — Emergency Department
Admission: EM | Admit: 2019-10-27 | Discharge: 2019-10-28 | Disposition: A | Discharge status: Home / Self Care | Payer: Medicare Other | Attending: Emergency Medicine | Admitting: Emergency Medicine

## 2019-10-27 ENCOUNTER — Other Ambulatory Visit: Payer: Self-pay

## 2019-10-27 DIAGNOSIS — D012 Carcinoma in situ of rectum: Secondary | ICD-10-CM | POA: Insufficient documentation

## 2019-10-27 DIAGNOSIS — R42 Dizziness and giddiness: Secondary | ICD-10-CM | POA: Diagnosis not present

## 2019-10-27 DIAGNOSIS — E119 Type 2 diabetes mellitus without complications: Secondary | ICD-10-CM | POA: Diagnosis not present

## 2019-10-27 DIAGNOSIS — Z79899 Other long term (current) drug therapy: Secondary | ICD-10-CM | POA: Insufficient documentation

## 2019-10-27 DIAGNOSIS — R531 Weakness: Secondary | ICD-10-CM | POA: Diagnosis present

## 2019-10-27 DIAGNOSIS — I10 Essential (primary) hypertension: Secondary | ICD-10-CM | POA: Diagnosis not present

## 2019-10-27 NOTE — ED Provider Notes (Addendum)
[facility] Emergency Department Provider Note  ____________________________________________   First MD Initiated Contact with Patient 10/27/19 2344     (approximate)  I have reviewed the triage vital signs and the nursing notes.   HISTORY  Chief Complaint Weakness    HPI John Shaw is a 79 y.o. male with below list of previous medical conditions including hypertension diabetes rectal cancer presents to the emergency department via EMS secondary to generalized weakness which began tonight. Patient states that while in bed going to sleep tonight he felt "weak all over". Patient also admits to dizziness none at present however states occurs with attempting to ambulate. Patient denies any pain. Patient denies any nausea vomiting diarrhea. Patient denies any urinary symptoms.        Past Medical History:  Diagnosis Date  . Diabetes mellitus without complication ([place])   . Hypertension   . Rectal cancer [facility]) 2014    Patient Active Problem List   Diagnosis Date Noted  . Diabetes mellitus without complication ([place])   . Hypertension   . Rectal cancer [facility])     Past Surgical History:  Procedure Laterality Date  . COLONOSCOPY  05/31/2014  . COLONOSCOPY WITH PROPOFOL N/A 09/03/2017   Procedure: COLONOSCOPY WITH PROPOFOL;  Surgeon: Robert Bellow, MD;  Location: [facility] ENDOSCOPY;  Service: Endoscopy;  Laterality: N/A;  . ROBOT ASSISTED LAPAROSCOPIC PARTIAL COLECTOMY  2016   Dr Audie Clear    Prior to Admission medications   Medication Sig Start Date End Date Taking? Authorizing Provider  Melatonin 1 MG CAPS Take by mouth at bedtime.    Yes [provider]    Allergies Patient has no known allergies.  Family History  Problem Relation Age of Onset  . Colon cancer Neg Hx     Social History Social History   Tobacco Use  . Smoking status: Never Smoker  . Smokeless tobacco: Never Used  Substance Use Topics  . Alcohol use: No  .  Drug use: No    Review of Systems Constitutional: No fever/chills Eyes: No visual changes. ENT: No sore throat. Cardiovascular: Denies chest pain. Respiratory: Denies shortness of breath. Gastrointestinal: No abdominal pain.  No nausea, no vomiting.  No diarrhea.  No constipation. Genitourinary: Negative for dysuria. Musculoskeletal: Negative for neck pain.  Negative for back pain. Integumentary: Negative for rash. Neurological: Negative for headaches, positive for generalized weakness. Patient denies any focal weakness or numbness  ____________________________________________   PHYSICAL EXAM:  VITAL SIGNS: ED Triage Vitals  Enc Vitals Group     BP      Pulse      Resp      Temp      Temp src      SpO2      Weight      Height      Head Circumference      Peak Flow      Pain Score      Pain Loc      Pain Edu?      Excl. in [place]?     Constitutional: Alert and oriented.  Eyes: Conjunctivae are normal.  Head: Atraumatic. Mouth/Throat: Patient is wearing a mask. Neck: No stridor.  No meningeal signs.   Cardiovascular: Normal rate, regular rhythm. Good peripheral circulation. Grossly normal heart sounds. Respiratory: Normal respiratory effort.  No retractions. Gastrointestinal: Soft and nontender. No distention.  Musculoskeletal: No lower extremity tenderness nor edema. No gross deformities of extremities. Neurologic:  Normal speech and language.  No gross focal neurologic deficits are appreciated.  Skin:  Skin is warm, dry and intact. Psychiatric: Mood and affect are normal. Speech and behavior are normal.  ____________________________________________   LABS (all labs ordered are listed, but only abnormal results are displayed)  Labs Reviewed  CBC - Abnormal; Notable for the following components:      Result Value   RBC 4.20 (*)    HCT 37.8 (*)    All other components within normal limits  COMPREHENSIVE METABOLIC PANEL - Abnormal; Notable for the following  components:   Sodium 133 (*)    Glucose, Bld 140 (*)    Calcium 8.4 (*)    All other components within normal limits  URINALYSIS, COMPLETE (UACMP) WITH MICROSCOPIC - Abnormal; Notable for the following components:   Color, Urine COLORLESS (*)    APPearance CLEAR (*)    Specific Gravity, Urine 1.003 (*)    All other components within normal limits  URINE CULTURE  TROPONIN I (HIGH SENSITIVITY)   ____________________________________________  EKG  ED ECG REPORT I, [place] N Lakresha Stifter, the attending physician, personally viewed and interpreted this ECG.   Date: 10/28/2019  EKG Time: 11:49 PM  Rate: 73  Rhythm: Normal sinus rhythm  Axis: Normal  Intervals: Normal  ST&T Change: None  ____________________________________________  RADIOLOGY I, [place] N M[place], personally viewed and evaluated these images (plain radiographs) as part of my medical decision making, as well as reviewing the written report by the radiologist.  ED MD interpretation: No acute intracranial abnormality noted on CT head per radiologist.  Official radiology report(s): CT Head Wo Contrast  Result Date: 10/28/2019 CLINICAL DATA:  Ataxia, stroke suspected EXAM: CT HEAD WITHOUT CONTRAST TECHNIQUE: Contiguous axial images were obtained from the base of the skull through the vertex without intravenous contrast. COMPARISON:  PET-CT 06/12/2017, CT head 02/25/2016 FINDINGS: Brain: No evidence of acute infarction, hemorrhage, hydrocephalus, extra-axial collection or mass lesion/mass effect. Symmetric prominence of the ventricles, cisterns and sulci compatible with parenchymal volume loss. Patchy areas of white matter hypoattenuation are most compatible with chronic microvascular angiopathy. Vascular: Atherosclerotic calcification of the carotid siphons and intradural vertebral arteries. No hyperdense vessel. Skull: No calvarial fracture or suspicious osseous lesion. No scalp swelling or hematoma. Sinuses/Orbits: Polypoid mural  thickening within the ethmoid sinuses and nasal cavity with some thinning of the ethmoid septa particularly on the right (3/25). Additional mild mural thickening and possible polyp versus retention cyst in the right maxillary sinus and floor of the left maxillary sinus. Sphenoid sinuses and frontal sinuses are predominantly clear. Mastoid air cells and middle ear cavities are clear. Small amount of debris in the right external auditory canal. Included orbital structures are unremarkable. Other: None IMPRESSION: 1. No acute intracranial abnormality. 2. Chronic microvascular angiopathy and parenchymal volume loss. 3. Chronic polypoid mural thickening within the ethmoid sinuses and nasal cavity with some thinning of the ethmoid septa particularly on the right. 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 Could consider outpatient ENT consultation. 4. Small amount of debris in the right external auditory canal, correlate for cerumen impaction. Electronically Signed   By: Lovena Le M.D.   On: 10/28/2019 00:32    ____  Procedures   ____________________________________________   INITIAL IMPRESSION / MDM / ASSESSMENT AND PLAN / ED COURSE  As part of my medical decision making, I reviewed the following data within the electronic MEDICAL RECORD NUMBER   78 year old male presented with above-stated history and physical exam secondary to weakness differential diagnosis including but not  limited to electrolyte abnormality in particular hyponatremia, CVA, urinary tract infection, arrhythmia.  Laboratory data unremarkable including urinalysis.  CT head revealed no acute intracranial abnormality urinalysis also negative.  No clear etiology for the patient's generalized weakness identified while in the emergency department.  Patient asymptomatic at present we will discharge patient with outpatient follow-up.  ____________________________________________  FINAL CLINICAL IMPRESSION(S) / ED DIAGNOSES  Final  diagnoses:  Weakness     MEDICATIONS GIVEN DURING THIS VISIT:  Medications - No data to display   ED Discharge Orders    None      *Please note:  Jamar Weatherall was evaluated in Emergency Department on 10/28/2019 for the symptoms described in the history of present illness. He was evaluated in the context of the global COVID-19 pandemic, which necessitated consideration that the patient might be at risk for infection with the SARS-CoV-2 virus that causes COVID-19. Institutional protocols and algorithms that pertain to the evaluation of patients at risk for COVID-19 are in a state of rapid change based on information released by regulatory bodies including the CDC and federal and state organizations. These policies and algorithms were followed during the patient's care in the ED.  Some ED evaluations and interventions may be delayed as a result of limited staffing during the pandemic.*  Note:  This document was prepared using Dragon voice recognition software and may include unintentional dictation errors.   Gregor Hams, MD 10/28/19 2419    Gregor Hams, MD 10/28/19 [phone]

## 2019-10-27 NOTE — ED Triage Notes (Addendum)
Pt to the er for weakness. Pt states this started a few days ago. Caregiver states otherwise. Vitals on truck WNL. Pt has a hx of cancer. Pt states he has some neck pain but other than that no other symptoms

## 2019-10-28 ENCOUNTER — Emergency Department: Payer: Medicare Other

## 2019-10-28 DIAGNOSIS — R531 Weakness: Secondary | ICD-10-CM | POA: Diagnosis not present

## 2019-10-28 LAB — URINALYSIS, COMPLETE (UACMP) WITH MICROSCOPIC
Bacteria, UA: NONE SEEN
Bilirubin Urine: NEGATIVE
Glucose, UA: NEGATIVE mg/dL
Hgb urine dipstick: NEGATIVE
Ketones, ur: NEGATIVE mg/dL
Leukocytes,Ua: NEGATIVE
Nitrite: NEGATIVE
Protein, ur: NEGATIVE mg/dL
Specific Gravity, Urine: 1.003 — ABNORMAL LOW (ref 1.005–1.030)
Squamous Epithelial / LPF: NONE SEEN (ref 0–5)
pH: 8 (ref 5.0–8.0)

## 2019-10-28 LAB — COMPREHENSIVE METABOLIC PANEL
ALT: 14 U/L (ref 0–44)
AST: 20 U/L (ref 15–41)
Albumin: 3.7 g/dL (ref 3.5–5.0)
Alkaline Phosphatase: 73 U/L (ref 38–126)
Anion gap: 10 (ref 5–15)
BUN: 18 mg/dL (ref 8–23)
CO2: 22 mmol/L (ref 22–32)
Calcium: 8.4 mg/dL — ABNORMAL LOW (ref 8.9–10.3)
Chloride: 101 mmol/L (ref 98–111)
Creatinine, Ser: 1 mg/dL (ref 0.61–1.24)
GFR calc Af Amer: >60 mL/min (ref >60)
GFR calc non Af Amer: >60 mL/min (ref >60)
Glucose, Bld: 140 mg/dL — ABNORMAL HIGH (ref 70–99)
Potassium: 3.9 mmol/L (ref 3.5–5.1)
Sodium: 133 mmol/L — ABNORMAL LOW (ref 135–145)
Total Bilirubin: 1 mg/dL (ref 0.3–1.2)
Total Protein: 7 g/dL (ref 6.5–8.1)

## 2019-10-28 LAB — CBC
HCT: 37.8 % — ABNORMAL LOW (ref 39.0–52.0)
Hemoglobin: 13.4 g/dL (ref 13.0–17.0)
MCH: 31.9 pg (ref 26.0–34.0)
MCHC: 35.4 g/dL (ref 30.0–36.0)
MCV: 90 fL (ref 80.0–100.0)
Platelets: 179 10*3/uL (ref 150–400)
RBC: 4.2 MIL/uL — ABNORMAL LOW (ref 4.22–5.81)
RDW: 12.8 % (ref 11.5–15.5)
WBC: 5.1 10*3/uL (ref 4.0–10.5)
nRBC: 0 % (ref 0.0–0.2)

## 2019-10-28 LAB — TROPONIN I (HIGH SENSITIVITY): Troponin I (High Sensitivity): 10 ng/L (ref <18)

## 2019-10-28 NOTE — ED Notes (Signed)
Pt requesting info on plan of care. Will speak with MD.

## 2019-10-28 NOTE — ED Notes (Signed)
Pt transported to radiology for imaging.

## 2019-10-28 NOTE — ED Notes (Signed)
Pt given urinal and stood at bedside w assistance. Pt placed in clean brief.

## 2019-10-29 LAB — URINE CULTURE: Culture: <10000 — AB

## 2020-02-06 ENCOUNTER — Emergency Department
Admission: EM | Admit: 2020-02-06 | Discharge: 2020-02-06 | Disposition: A | Discharge status: Home / Self Care | Payer: Medicare Other | Attending: Student in an Organized Health Care Education/Training Program | Admitting: Student in an Organized Health Care Education/Training Program

## 2020-02-06 ENCOUNTER — Emergency Department: Payer: Medicare Other

## 2020-02-06 ENCOUNTER — Encounter: Payer: Self-pay | Admitting: Emergency Medicine

## 2020-02-06 ENCOUNTER — Other Ambulatory Visit: Payer: Self-pay

## 2020-02-06 DIAGNOSIS — I10 Essential (primary) hypertension: Secondary | ICD-10-CM | POA: Insufficient documentation

## 2020-02-06 DIAGNOSIS — M545 Low back pain, unspecified: Secondary | ICD-10-CM

## 2020-02-06 DIAGNOSIS — E119 Type 2 diabetes mellitus without complications: Secondary | ICD-10-CM | POA: Insufficient documentation

## 2020-02-06 LAB — URINALYSIS, COMPLETE (UACMP) WITH MICROSCOPIC
Bacteria, UA: NONE SEEN
Bilirubin Urine: NEGATIVE
Glucose, UA: NEGATIVE mg/dL
Ketones, ur: 5 mg/dL — AB
Leukocytes,Ua: NEGATIVE
Nitrite: NEGATIVE
Protein, ur: NEGATIVE mg/dL
Specific Gravity, Urine: 1.011 (ref 1.005–1.030)
pH: 5 (ref 5.0–8.0)

## 2020-02-06 MED ORDER — LIDOCAINE 5 % EX PTCH: 1.0000 | MEDICATED_PATCH | Freq: Two times a day (BID) | CUTANEOUS | 0 refills | Status: AC

## 2020-02-06 NOTE — ED Notes (Signed)
Pt assisted up to restroom for bowel movement.

## 2020-02-06 NOTE — ED Triage Notes (Signed)
FIRST NURSE NOTE: pt arrived via [facility] from home with reports of back pain with standing, pt reported to EMS he has had some recent falls but describes the falls as sitting up in bed and falling backwards on the bed.  Unknown what mental status is at baseline per EMS, alert and oriented x3 unknown what the year is.  186/78 p-63, 100% RA CBG 149

## 2020-02-06 NOTE — ED Provider Notes (Signed)
[facility] Emergency Department Provider Note  ____________________________________________  Time seen: Approximately 8:30 AM  I have reviewed the triage vital signs and the nursing notes.   HISTORY  Chief Complaint Back Pain    HPI John Shaw is a 79 y.o. male that presents to the emergency department for evaluation of intermittent low back pain.  Patient states that he has back pain when he goes to sit on the bed.  It causes him to need to "fall" backwards onto the bed while he is sitting on the bed.  He denies any true falls.  He lives with his cousin John Shaw.  He uses a walker.  He denies any pain or symptoms currently.  No abdominal pain.  Past Medical History:  Diagnosis Date  . Diabetes mellitus without complication ([place])   . Hypertension   . Rectal cancer [facility]) 2014    Patient Active Problem List   Diagnosis Date Noted  . Diabetes mellitus without complication ([place])   . Hypertension   . Rectal cancer [facility])     Past Surgical History:  Procedure Laterality Date  . COLONOSCOPY  05/31/2014  . COLONOSCOPY WITH PROPOFOL N/A 09/03/2017   Procedure: COLONOSCOPY WITH PROPOFOL;  Surgeon: Robert Bellow, MD;  Location: [facility] ENDOSCOPY;  Service: Endoscopy;  Laterality: N/A;  . ROBOT ASSISTED LAPAROSCOPIC PARTIAL COLECTOMY  2016   Dr Audie Clear    Prior to Admission medications   Medication Sig Start Date End Date Taking? Authorizing Provider  lidocaine (LIDODERM) 5 % Place 1 patch onto the skin every 12 (twelve) hours. Remove & Discard patch within 12 hours or as directed by MD 02/06/20 02/05/21  Laban Emperor, PA-C  Melatonin 1 MG CAPS Take by mouth at bedtime.     [provider]    Allergies Patient has no known allergies.  Family History  Problem Relation Age of Onset  . Colon cancer Neg Hx     Social History Social History   Tobacco Use  . Smoking status: Never Smoker  . Smokeless tobacco: Never Used  Substance Use  Topics  . Alcohol use: No  . Drug use: No     Review of Systems  Constitutional: No fever/chills Respiratory: No SOB. Gastrointestinal: No abdominal pain.  No nausea, no vomiting.  Genitourinary: Negative for dysuria. Musculoskeletal: Positive for intermittent back pain. Skin: Negative for rash, abrasions, lacerations, ecchymosis. Neurological: Negative for headaches   ____________________________________________   PHYSICAL EXAM:  VITAL SIGNS: ED Triage Vitals  Enc Vitals Group     BP 02/06/20 0125 (!) 181/58     Pulse Rate 02/06/20 0125 60     Resp 02/06/20 0125 18     Temp 02/06/20 0126 97.7 F (36.5 C)     Temp Source 02/06/20 0126 Oral     SpO2 02/06/20 0125 99 %     Weight 02/06/20 0125 149 lb 14.6 oz (68 kg)     Height 02/06/20 0125 5\' 7"  (1.702 m)     Head Circumference --      Peak Flow --      Pain Score 02/06/20 0125 10     Pain Loc --      Pain Edu? --      Excl. in [place]? --      Constitutional: Alert. No acute distress. Eyes: Conjunctivae are normal. PERRL. EOMI. Head: Atraumatic. ENT:      Ears:      Nose: No congestion/rhinnorhea.      Mouth/Throat: Mucous membranes  are moist.  Neck: No stridor.  Cardiovascular: Normal rate, regular rhythm.  Good peripheral circulation. Respiratory: Normal respiratory effort without tachypnea or retractions. Lungs CTAB. Good air entry to the bases with no decreased or absent breath sounds. Gastrointestinal: Bowel sounds 4 quadrants. Soft and nontender to palpation. No guarding or rigidity. No palpable masses. No distention. Musculoskeletal: Full range of motion to all extremities. No gross deformities appreciated. Neurologic:  Normal speech and language. No gross focal neurologic deficits are appreciated.  Skin:  Skin is warm, dry and intact. No rash noted.   ____________________________________________   LABS (all labs ordered are listed, but only abnormal results are displayed)  Labs Reviewed  URINALYSIS,  COMPLETE (UACMP) WITH MICROSCOPIC - Abnormal; Notable for the following components:      Result Value   Color, Urine YELLOW (*)    APPearance CLEAR (*)    Hgb urine dipstick SMALL (*)    Ketones, ur 5 (*)    All other components within normal limits   ____________________________________________  EKG   ____________________________________________  RADIOLOGY Robinette Haines, personally viewed and evaluated these images (plain radiographs) as part of my medical decision making, as well as reviewing the written report by the radiologist.  DG Lumbar Spine 2-3 Views  Result Date: 02/06/2020 CLINICAL DATA:  Recent fall, now with worsening back pain. EXAM: LUMBAR SPINE - 2-3 VIEW COMPARISON:  None. FINDINGS: There are 5 non rib-bearing lumbar type vertebral bodies. Straightening of the expected lumbar lordosis. No anterolisthesis or retrolisthesis. Lumbar vertebral body heights appear preserved. Moderate multilevel lumbar spine DDD, worse at L2-L3, L4-L5 and L5-S1 with disc space height loss, endplate irregularity and sclerosis. Limited visualization of the bilateral SI joints is normal. Phleboliths overlie the lower pelvis bilaterally. An enteric surgical line overlies the midline of the lower pelvis. Atherosclerotic plaque within the splenic artery. Regional bowel gas pattern is normal. IMPRESSION: 1. Mild straightening of the expected lumbar lordosis. Nonspecific though could be seen in the setting of muscle spasm. Otherwise, no acute findings. 2. Moderate multilevel lumbar spine DDD. Electronically Signed   By: Sandi Mariscal M.D.   On: 02/06/2020 09:22    ____________________________________________    PROCEDURES  Procedure(s) performed:    Procedures    Medications - No data to display   ____________________________________________   INITIAL IMPRESSION / ASSESSMENT AND PLAN / ED COURSE  Pertinent labs & imaging results that were available during my care of the patient were  reviewed by me and considered in my medical decision making (see chart for details).  Review of the [place] CSRS was performed in accordance of the [place] prior to dispensing any controlled drugs.     Patient presented to the emergency department for evaluation of intermittent low back pain. When patient does have discomfort, it is positional. Pain is likely musculoskeletal, as it is worse when he is moving. Patient denies any pain currently.   ----------------------------------------- 8:56 AM on 02/06/2020 -----------------------------------------  I spoke with John Shaw, patient's caregiver on the phone.  She states that he used to call EMS daily and she has been actively encouraging him to call her instead of EMS for the last 9 years.  She was unaware that he initially called EMS.  She has not had any concerns.  He does intermittently complain of low back pain to her.  She states that he tells her that his back pain is because he sits on the bed and then "falls " backwards onto the bed, straining his back.  He does use a walker at home.  He did fall about a week ago but no additional recent falls.  Patient sees Dr. Hall Busing hand has good follow-up with him.  John Shaw will come to pick up the patient when he is ready to go home.  ----------------------------------------- 10:00 AM on 02/06/2020 -----------------------------------------  X-ray negative for acute bony abnormality and consistent with chronic changes and possible muscle spasm.  No indication of infection on urinalysis.  Patient denies any pain or discomfort currently.  Patient will be discharged home with prescriptions for Lidoderm. Patient is to follow up with primary care as directed. Patient is given ED precautions to return to the ED for any worsening or new symptoms.   John Shaw was evaluated in Emergency Department on 02/06/2020 for the symptoms described in the history of present illness. He was evaluated in the context of the  global COVID-19 pandemic, which necessitated consideration that the patient might be at risk for infection with the SARS-CoV-2 virus that causes COVID-19. Institutional protocols and algorithms that pertain to the evaluation of patients at risk for COVID-19 are in a state of rapid change based on information released by regulatory bodies including the CDC and federal and state organizations. These policies and algorithms were followed during the patient's care in the ED.  ____________________________________________  FINAL CLINICAL IMPRESSION(S) / ED DIAGNOSES  Final diagnoses:  Bilateral low back pain without sciatica, unspecified chronicity      NEW MEDICATIONS STARTED DURING THIS VISIT:  ED Discharge Orders         Ordered    lidocaine (LIDODERM) 5 %  Every 12 hours        02/06/20 1038              This chart was dictated using voice recognition software/Dragon. Despite best efforts to proofread, errors can occur which can change the meaning. Any change was purely unintentional.    Laban Emperor, PA-C 02/06/20 1601    Merlyn Lot, MD 02/08/20 [phone]

## 2020-02-06 NOTE — ED Notes (Signed)
See triage note  Presents with lower back pain  States he fell  But has had several falls and his back pain has been going on for a while

## 2020-02-06 NOTE — ED Triage Notes (Signed)
Patient brought in by ems from home. Patient with complaint of lower back pain that has been going on for awhile. Patient states that he was unable to get up tonight due to the pain.

## 2020-02-06 NOTE — ED Triage Notes (Signed)
Pt assisted to bathroom, urine sent to lab with save label.

## 2020-02-06 NOTE — Discharge Instructions (Signed)
Your x-ray looks like you have some arthritis in your back and you are having some low back muscle spasms.  You can use the Lidoderm patch to your low back.  Also start applying heat to your low back.  There was a very small amount of blood in your urine.  Please make sure you are drinking plenty of water and staying well-hydrated.  Please call your primary care doctor for a follow-up appointment to recheck your symptoms this week.

## 2020-02-17 ENCOUNTER — Emergency Department
Admission: EM | Admit: 2020-02-17 | Discharge: 2020-02-17 | Disposition: A | Discharge status: Home / Self Care | Payer: Medicare Other | Attending: Emergency Medicine | Admitting: Emergency Medicine

## 2020-02-17 ENCOUNTER — Encounter: Payer: Self-pay | Admitting: Emergency Medicine

## 2020-02-17 ENCOUNTER — Emergency Department: Payer: Medicare Other

## 2020-02-17 ENCOUNTER — Other Ambulatory Visit: Payer: Self-pay

## 2020-02-17 DIAGNOSIS — E119 Type 2 diabetes mellitus without complications: Secondary | ICD-10-CM | POA: Diagnosis not present

## 2020-02-17 DIAGNOSIS — M545 Low back pain, unspecified: Secondary | ICD-10-CM

## 2020-02-17 DIAGNOSIS — I10 Essential (primary) hypertension: Secondary | ICD-10-CM | POA: Diagnosis not present

## 2020-02-17 DIAGNOSIS — Z85048 Personal history of other malignant neoplasm of rectum, rectosigmoid junction, and anus: Secondary | ICD-10-CM | POA: Insufficient documentation

## 2020-02-17 NOTE — ED Triage Notes (Addendum)
Pt arrived via [facility] from home with reports of low back pain since Saturday.  Pt seen on 9/19 for back pain. Pt has friend Levada Dy that helps care for him, but states he has not contacted her yet.  Pt reports he falls backwards on the bed and reports his back hurts.   Pt is alert and oriented at this time. Pt states he has been using the patch to his back for pain.

## 2020-02-17 NOTE — ED Notes (Signed)
See triage note  Presents with lower back pain  States he has chronic back pain  But this episode started on saturday

## 2020-02-17 NOTE — ED Notes (Signed)
This nurse has attempted to call his POA  No answer and I have left messages

## 2020-02-17 NOTE — ED Notes (Signed)
Pt is up in W/C  Was taken to bathroom with assistance

## 2020-02-17 NOTE — ED Provider Notes (Signed)
[facility] Emergency Department Provider Note  ____________________________________________   None    (approximate)  I have reviewed the triage vital signs and the nursing notes.   HISTORY  Chief Complaint Back Pain   HPI John Shaw is a 79 y.o. male is brought to the ED via EMS with report of low back pain for the last 6 days.  Patient states that he was sitting on the side of the bed and "fell backwards on the bed".  He denies any head injury or loss of consciousness.  He states that he did not fall to the floor.  He states that he has had problems with his back in the past.  He has not taken any over-the-counter medication for his back but is currently using a lidocaine patch.  He denies any urinary symptoms.      Past Medical History:  Diagnosis Date  . Diabetes mellitus without complication ([place])   . Hypertension   . 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 Diabetes mellitus without complication ([place])   . Hypertension   . Rectal cancer [facility])     Past Surgical History:  Procedure Laterality Date  . COLONOSCOPY  05/31/2014  . COLONOSCOPY WITH PROPOFOL N/A 09/03/2017   Procedure: COLONOSCOPY WITH PROPOFOL;  Surgeon: Robert Bellow, MD;  Location: [facility] ENDOSCOPY;  Service: Endoscopy;  Laterality: N/A;  . ROBOT ASSISTED LAPAROSCOPIC PARTIAL COLECTOMY  2016   Dr Audie Clear    Prior to Admission medications   Medication Sig Start Date End Date Taking? Authorizing Provider  lidocaine (LIDODERM) 5 % Place 1 patch onto the skin every 12 (twelve) hours. Remove & Discard patch within 12 hours or as directed by MD 02/06/20 02/05/21  Laban Emperor, PA-C  Melatonin 1 MG CAPS Take by mouth at bedtime.     [provider]    Allergies Patient has no known allergies.  Family History  Problem Relation Age of Onset  . Colon cancer Neg Hx     Social History Social History   Tobacco Use  .  Smoking status: Never Smoker  . Smokeless tobacco: Never Used  Substance Use Topics  . Alcohol use: No  . Drug use: No    Review of Systems Constitutional: No fever/chills Eyes: No visual changes. ENT: No sore throat. Cardiovascular: Denies chest pain. Respiratory: Denies shortness of breath. Gastrointestinal: No abdominal pain.  No nausea, no vomiting.  No diarrhea.  No constipation. Genitourinary: Negative for dysuria. Musculoskeletal: Positive for low back pain. Skin: Negative for rash. Neurological: Negative for headaches, focal weakness or numbness. ____________________________________________   PHYSICAL EXAM:  VITAL SIGNS: ED Triage Vitals  Enc Vitals Group     BP 02/17/20 0747 (!) 168/49     Pulse Rate 02/17/20 0747 60     Resp 02/17/20 0747 18     Temp 02/17/20 0747 97.6 F (36.4 C)     Temp Source 02/17/20 0747 Oral     SpO2 02/17/20 0747 99 %     Weight 02/17/20 0748 149 lb 14.6 oz (68 kg)     Height 02/17/20 0748 5\' 7"  (1.702 m)     Head Circumference --      Peak Flow --      Pain Score 02/17/20 0748 10     Pain Loc --      Pain Edu? --      Excl. in [place]? --  Constitutional: Alert and oriented. Well appearing and in no acute distress.  Patient is able to answer simple questions.   Eyes: Conjunctivae are normal.  Head: Atraumatic. Neck: No stridor.   Cardiovascular: Normal rate, regular rhythm. Grossly normal heart sounds.  Good peripheral circulation. Respiratory: Normal respiratory effort.  No retractions. Lungs CTAB. Gastrointestinal: Soft and nontender. No distention.  Bowel sounds normoactive x4 quadrants.  No CVA tenderness. Musculoskeletal: On examination of the back there is no gross deformity, soft tissue edema or skin discoloration.  Patient is able move upper and lower extremities however he is unable to stand without assistance which is his baseline as he has been seen in the emergency department multiple times. Neurologic:  Normal speech  and language. No gross focal neurologic deficits are appreciated.  Skin:  Skin is warm, dry and intact. No rash noted.  No abrasions or discoloration noted. Psychiatric: Mood and affect are normal. Speech and behavior are normal.  ____________________________________________   LABS (all labs ordered are listed, but only abnormal results are displayed)  Labs Reviewed - No data to display ____________________________________________   RADIOLOGY   Official radiology report(s): DG Lumbar Spine 2-3 Views  Result Date: 02/17/2020 CLINICAL DATA:  Pain, fall EXAM: LUMBAR SPINE - 2-3 VIEW COMPARISON:  None. FINDINGS: No fracture or dislocation of the lumbar spine. Mild multilevel disc space height loss and osteophytosis. Moderate multilevel facet degenerative change. Nonobstructive pattern of overlying bowel gas. IMPRESSION: No fracture or dislocation of the lumbar spine. Mild multilevel disc space height loss and osteophytosis. Moderate multilevel facet degenerative change. Electronically Signed   By: Eddie Candle M.D.   On: 02/17/2020 10:16    ____________________________________________   PROCEDURES  Procedure(s) performed (including Critical Care):  Procedures   ____________________________________________   INITIAL IMPRESSION / ASSESSMENT AND PLAN / ED COURSE  As part of my medical decision making, I reviewed the following data within the electronic MEDICAL RECORD NUMBER Notes from prior ED visits and [place] Controlled Substance Database  79 year old male presents to the ED with complaint of low back pain.  Patient has history of chronic back pain and currently is wearing a Lidoderm patch.  Patient was seen last week for his chronic pain in the emergency department.  He has not followed up with his PCP.  He reports that for today's visit his back hurts because "he fell backwards onto the bed".  Patient denies any direct trauma to his back nor did he fall to the floor.  Physical exam is  benign.  X-rays were negative for any acute bony injury.  Patient is encouraged to continue using his Lidoderm patch.  He is also to follow-up with his PCP if any continued problems.  ----------------------------------------- 2:08 PM on 02/17/2020 ----------------------------------------- Multiple phone calls to patient's friend that helps take care of him by the name of Levada Dy without success.  Phone calls go to voicemail.  Patient does not know any other phone number or any other person who can pick him up.  Patient is not a candidate to be sent home by taxicab.  He does not meet medical necessity to return to his home by ambulance.  An order was placed for transition of care.  Patient currently is eating snacks.  Patient was discharged to caregiver Levada Dy.  Patient was given discharge instructions and encouraged to continue with his lidocaine patches.  ____________________________________________   FINAL CLINICAL IMPRESSION(S) / ED DIAGNOSES  Final diagnoses:  Bilateral low back pain without sciatica, unspecified chronicity  ED Discharge Orders    None      *Please note:  John Shaw was evaluated in Emergency Department on 02/17/2020 for the symptoms described in the history of present illness. He was evaluated in the context of the global COVID-19 pandemic, which necessitated consideration that the patient might be at risk for infection with the SARS-CoV-2 virus that causes COVID-19. Institutional protocols and algorithms that pertain to the evaluation of patients at risk for COVID-19 are in a state of rapid change based on information released by regulatory bodies including the CDC and federal and state organizations. These policies and algorithms were followed during the patient's care in the ED.  Some ED evaluations and interventions may be delayed as a result of limited staffing during and the pandemic.*   Note:  This document was prepared using Dragon voice recognition  software and may include unintentional dictation errors.    Johnn Hai, PA-C 02/17/20 1546    Blake Divine, MD 02/18/20 [id number]

## 2020-02-17 NOTE — Discharge Instructions (Signed)
Call make an appointment with Dr. Hall Busing who is your primary care provider for reevaluation of your back pain.  Continue using the Lidoderm patch that was prescribed for you last week.  You may use ice or heat to your back as needed for discomfort.

## 2020-05-02 ENCOUNTER — Emergency Department: Payer: Medicare Other

## 2020-05-02 ENCOUNTER — Inpatient Hospital Stay
Admission: EM | Admit: 2020-05-02 | Discharge: 2020-05-08 | DRG: 592 | Disposition: A | Discharge status: Skilled Nursing Facility | Payer: Medicare Other | Attending: Hospitalist | Admitting: Hospitalist

## 2020-05-02 ENCOUNTER — Other Ambulatory Visit: Payer: Self-pay

## 2020-05-02 ENCOUNTER — Encounter: Payer: Self-pay | Admitting: Radiology

## 2020-05-02 DIAGNOSIS — Z85048 Personal history of other malignant neoplasm of rectum, rectosigmoid junction, and anus: Secondary | ICD-10-CM

## 2020-05-02 DIAGNOSIS — S065X9A Traumatic subdural hemorrhage with loss of consciousness of unspecified duration, initial encounter: Secondary | ICD-10-CM | POA: Diagnosis not present

## 2020-05-02 DIAGNOSIS — R700 Elevated erythrocyte sedimentation rate: Secondary | ICD-10-CM | POA: Diagnosis present

## 2020-05-02 DIAGNOSIS — R531 Weakness: Secondary | ICD-10-CM | POA: Diagnosis present

## 2020-05-02 DIAGNOSIS — S0240EA Zygomatic fracture, right side, initial encounter for closed fracture: Secondary | ICD-10-CM | POA: Diagnosis present

## 2020-05-02 DIAGNOSIS — Z79899 Other long term (current) drug therapy: Secondary | ICD-10-CM

## 2020-05-02 DIAGNOSIS — I10 Essential (primary) hypertension: Secondary | ICD-10-CM | POA: Diagnosis present

## 2020-05-02 DIAGNOSIS — R338 Other retention of urine: Secondary | ICD-10-CM

## 2020-05-02 DIAGNOSIS — M545 Low back pain, unspecified: Secondary | ICD-10-CM | POA: Diagnosis present

## 2020-05-02 DIAGNOSIS — L03317 Cellulitis of buttock: Secondary | ICD-10-CM | POA: Diagnosis present

## 2020-05-02 DIAGNOSIS — E119 Type 2 diabetes mellitus without complications: Secondary | ICD-10-CM | POA: Diagnosis present

## 2020-05-02 DIAGNOSIS — W19XXXA Unspecified fall, initial encounter: Secondary | ICD-10-CM | POA: Diagnosis present

## 2020-05-02 DIAGNOSIS — Y92009 Unspecified place in unspecified non-institutional (private) residence as the place of occurrence of the external cause: Secondary | ICD-10-CM

## 2020-05-02 DIAGNOSIS — L89153 Pressure ulcer of sacral region, stage 3: Secondary | ICD-10-CM | POA: Diagnosis present

## 2020-05-02 DIAGNOSIS — Z20822 Contact with and (suspected) exposure to covid-19: Secondary | ICD-10-CM | POA: Diagnosis present

## 2020-05-02 DIAGNOSIS — S065XAA Traumatic subdural hemorrhage with loss of consciousness status unknown, initial encounter: Secondary | ICD-10-CM

## 2020-05-02 DIAGNOSIS — L089 Local infection of the skin and subcutaneous tissue, unspecified: Secondary | ICD-10-CM

## 2020-05-02 DIAGNOSIS — K611 Rectal abscess: Secondary | ICD-10-CM

## 2020-05-02 DIAGNOSIS — S02841A Fracture of lateral orbital wall, right side, initial encounter for closed fracture: Secondary | ICD-10-CM | POA: Diagnosis present

## 2020-05-02 DIAGNOSIS — S0292XA Unspecified fracture of facial bones, initial encounter for closed fracture: Secondary | ICD-10-CM

## 2020-05-02 DIAGNOSIS — R339 Retention of urine, unspecified: Secondary | ICD-10-CM | POA: Diagnosis present

## 2020-05-02 LAB — CBC WITH DIFFERENTIAL/PLATELET
Abs Immature Granulocytes: 0.06 10*3/uL (ref 0.00–0.07)
Basophils Absolute: 0 10*3/uL (ref 0.0–0.1)
Basophils Relative: 0 %
Eosinophils Absolute: 0 10*3/uL (ref 0.0–0.5)
Eosinophils Relative: 0 %
HCT: 39.3 % (ref 39.0–52.0)
Hemoglobin: 13.2 g/dL (ref 13.0–17.0)
Immature Granulocytes: 1 %
Lymphocytes Relative: 9 %
Lymphs Abs: 1.1 10*3/uL (ref 0.7–4.0)
MCH: 31.3 pg (ref 26.0–34.0)
MCHC: 33.6 g/dL (ref 30.0–36.0)
MCV: 93.1 fL (ref 80.0–100.0)
Monocytes Absolute: 0.9 10*3/uL (ref 0.1–1.0)
Monocytes Relative: 7 %
Neutro Abs: 10.4 10*3/uL — ABNORMAL HIGH (ref 1.7–7.7)
Neutrophils Relative %: 83 %
Platelets: 201 10*3/uL (ref 150–400)
RBC: 4.22 MIL/uL (ref 4.22–5.81)
RDW: 13.2 % (ref 11.5–15.5)
WBC: 12.4 10*3/uL — ABNORMAL HIGH (ref 4.0–10.5)
nRBC: 0 % (ref 0.0–0.2)

## 2020-05-02 LAB — BASIC METABOLIC PANEL
Anion gap: 10 (ref 5–15)
BUN: 17 mg/dL (ref 8–23)
CO2: 24 mmol/L (ref 22–32)
Calcium: 8.8 mg/dL — ABNORMAL LOW (ref 8.9–10.3)
Chloride: 103 mmol/L (ref 98–111)
Creatinine, Ser: 1.06 mg/dL (ref 0.61–1.24)
GFR, Estimated: >60 mL/min (ref >60)
Glucose, Bld: 118 mg/dL — ABNORMAL HIGH (ref 70–99)
Potassium: 3.7 mmol/L (ref 3.5–5.1)
Sodium: 137 mmol/L (ref 135–145)

## 2020-05-02 LAB — URINALYSIS, COMPLETE (UACMP) WITH MICROSCOPIC
Bacteria, UA: NONE SEEN
Bilirubin Urine: NEGATIVE
Glucose, UA: 50 mg/dL — AB
Ketones, ur: 5 mg/dL — AB
Leukocytes,Ua: NEGATIVE
Nitrite: NEGATIVE
Protein, ur: NEGATIVE mg/dL
Specific Gravity, Urine: 1.018 (ref 1.005–1.030)
Squamous Epithelial / HPF: NONE SEEN (ref 0–5)
pH: 6 (ref 5.0–8.0)

## 2020-05-02 LAB — SEDIMENTATION RATE: Sed Rate: 60 mm/hr — ABNORMAL HIGH (ref 0–20)

## 2020-05-02 MED ORDER — IOHEXOL 300 MG/ML  SOLN
100.0000 mL | Freq: Once | INTRAMUSCULAR | Status: AC | PRN
  Administered 2020-05-02: 18:00:00 100 mL via INTRAVENOUS

## 2020-05-02 MED ORDER — SODIUM CHLORIDE 0.9 % IV SOLN
1.0000 g | Freq: Once | INTRAVENOUS | Status: AC
  Administered 2020-05-02: 23:00:00 1 g via INTRAVENOUS
  Filled 2020-05-02: qty 1

## 2020-05-02 NOTE — ED Notes (Signed)
Bladder scan was 575ML per ed md place a foley cath

## 2020-05-02 NOTE — ED Triage Notes (Signed)
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

## 2020-05-02 NOTE — ED Notes (Addendum)
Pt yelling help in room. Pt needed repositioning. Changed patient bedding had iodine stain,s put gown on, put belongings in patient belongings bag. Emptied foley bag documented amount in I&O.AS

## 2020-05-02 NOTE — ED Provider Notes (Signed)
[facility] Emergency Department Provider Note   ____________________________________________   Event Date/Time   First MD Initiated Contact with Patient 05/02/20 1610     (approximate)  I have reviewed the triage vital signs and the nursing notes.   HISTORY  Chief Complaint Rectal Pain and Wound Check    HPI John Shaw is a 79 y.o. male who was sent from home because he is having pain in his left buttock and rear and has had a decubitus.  He is seeing his doctor on Monday apparently but is hurting too much and he could not stay home.  Here patient is sitting on his buttocks without any pain.  He does complain of some pain over the pubic area.  He also has a little bruise under his eye he said he fell and hit his head yesterday.         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[facility]) 2014    Patient Active Problem List   Diagnosis Date Noted   Diabetes mellitus without complication ([place])    Hypertension    Rectal cancer [facility])     Past Surgical History:  Procedure Laterality Date   COLONOSCOPY  05/31/2014   COLONOSCOPY WITH PROPOFOL N/A 09/03/2017   Procedure: COLONOSCOPY WITH PROPOFOL;  Surgeon: Robert Bellow, MD;  Location: [facility] ENDOSCOPY;  Service: Endoscopy;  Laterality: N/A;   ROBOT ASSISTED LAPAROSCOPIC PARTIAL COLECTOMY  2016   Dr Audie Clear    Prior to Admission medications   Medication Sig Start Date End Date Taking? Authorizing Provider  lidocaine (LIDODERM) 5 % Place 1 patch onto the skin every 12 (twelve) hours. Remove & Discard patch within 12 hours or as directed by MD 02/06/20 02/05/21  Laban Emperor, PA-C  Melatonin 1 MG CAPS Take by mouth at bedtime.     [provider]    Allergies Patient has no known allergies.  Family History  Problem Relation Age of Onset   Colon cancer Neg Hx     Social History Social  History   Tobacco Use   Smoking status: Never Smoker   Smokeless tobacco: Never Used  Substance Use Topics   Alcohol use: No   Drug use: No    Review of Systems  Constitutional: No fever/chills Eyes: No visual changes. ENT: No sore throat. Cardiovascular: Denies chest pain. Respiratory: Denies shortness of breath. Gastrointestinal: No abdominal pain.  No nausea, no vomiting.  No diarrhea.  No constipation. Genitourinary: Negative for dysuria. Musculoskeletal: Negative for back pain. Skin: Negative for rash. Neurological: Negative for headaches, focal weakness   ____________________________________________   PHYSICAL EXAM:  VITAL SIGNS: ED Triage Vitals  Enc Vitals Group     BP 05/02/20 1237 (!) 168/83     Pulse Rate 05/02/20 1237 85     Resp 05/02/20 1237 18     Temp 05/02/20 1237 98.9 F (37.2 C)     Temp Source 05/02/20 1237 Oral     SpO2 05/02/20 1237 100 %     Weight 05/02/20 1241 150 lb (68 kg)     Height 05/02/20 1241 5\' 7"  (1.702 m)     Head Circumference --      Peak Flow --      Pain Score 05/02/20 1240 10     Pain Loc --      Pain Edu? --      Excl. in  GC? --     Constitutional: Alert and oriented. Well appearing and in no acute distress. Eyes: Conjunctivae are normal. PERRL. EOMI. Head: Atraumatic except bruise mentioned in HPI. Nose: No congestion/rhinnorhea. Mouth/Throat: Mucous membranes are moist.  Oropharynx non-erythematous. Neck: No stridor. Cardiovascular: Normal rate, regular rhythm. Grossly normal heart sounds.  Good peripheral circulation. Respiratory: Normal respiratory effort.  No retractions. Lungs CTAB. Gastrointestinal: Soft and nontender except slightly suprapubically. No distention. No abdominal bruits. No CVA tenderness. Genitourinary: Patient has a small ulcer in the area where you would normally get a pilonidal cyst.  There is very little surrounding erythema.  He has a good deal of erythema however on hold buttocks where he  normally sits in a small 2 mm ulcer/decubitus in the middle of the left buttocks.  Does not seem to be any firmness or fluctuance around it. Musculoskeletal: No lower extremity tenderness nor edema.  Neurologic:  Normal speech and language.  Patient with baseline tremor which is apparently old. Skin:  Skin is warm, dry and intact. No rash noted.   ____________________________________________   LABS (all labs ordered are listed, but only abnormal results are displayed)  Labs Reviewed  BASIC METABOLIC PANEL - Abnormal; Notable for the following components:      Result Value   Glucose, Bld 118 (*)    Calcium 8.8 (*)    All other components within normal limits  CBC WITH DIFFERENTIAL/PLATELET - Abnormal; Notable for the following components:   WBC 12.4 (*)    Neutro Abs 10.4 (*)    All other components within normal limits  SEDIMENTATION RATE - Abnormal; Notable for the following components:   Sed Rate 60 (*)    All other components within normal limits  URINALYSIS, COMPLETE (UACMP) WITH MICROSCOPIC - Abnormal; Notable for the following components:   Color, Urine YELLOW (*)    APPearance CLEAR (*)    Glucose, UA 50 (*)    Hgb urine dipstick SMALL (*)    Ketones, ur 5 (*)    All other components within normal limits   ____________________________________________  EKG   ____________________________________________  RADIOLOGY Gertha Calkin, personally viewed and evaluated these images (plain radiographs) as part of my medical decision making, as well as reviewing the written report by the radiologist.  ED MD interpretation: CT of the head read by radiology shows a 5 mm subdural hematoma.  Discussed this with Dr. Lacinda Axon neurosurgery who says if it is a 5 mm subdural hematoma with minimal mass-effect we can reCT him in 6 hours if it is unchanged he is stable and can have only outpatient follow-up CT of the C-spine is negative CT of the maxillofacial area shows a displaced  fracture of the zygoma and wall of the lateral wall of the right orbit.  Discussed this with ENT.  This is also outpatient follow-up within 2 weeks if it is badly displaced.  There is enough swelling present now it is difficult to tell. CT of the abdomen and pelvis shows some perirectal air which is slightly improved from previously but some perirectal soft tissue thickening which is worsened and some thickened soft tissue over the sacrum which is worsened.  Official radiology report(s): CT HEAD WO CONTRAST  Result Date: 05/02/2020 CLINICAL DATA:  Head trauma, minor. Additional history provided: Fall yesterday, bruise to upper eye/right forehead. EXAM: CT HEAD WITHOUT CONTRAST CT CERVICAL SPINE WITHOUT CONTRAST TECHNIQUE: Multidetector CT imaging of the head and cervical spine was performed following the standard protocol  without intravenous contrast. Multiplanar CT image reconstructions of the cervical spine were also generated. COMPARISON:  Prior head CT examinations 10/28/2019 and earlier. FINDINGS: CT HEAD FINDINGS Brain: Mild cerebral atrophy. Acute subdural hematoma overlying the right frontal operculum and right temporal lobe measuring 5 mm in thickness (for instance as seen on series 2, image 19) (series 4, image 25). Minimal mass effect upon the underlying right frontal lobe. No midline shift. Moderately advanced ill-defined hypoattenuation within the cerebral white matter is nonspecific, but compatible with chronic small vessel ischemic disease. No demarcated cortical infarct. No extra-axial fluid collection. No evidence of intracranial mass. Vascular: No hyperdense vessel.  Atherosclerotic calcifications. Skull: Normal. Negative for fracture or focal lesion. Sinuses/Orbits: Visualized orbits show no acute finding. Postsurgical appearance of the ethmoid sinuses with polypoid mucosal thickening. Trace mucosal thickening and small mucous retention cysts within the right maxillary sinus. Other:  Displaced fracture of the right zygomatic arch. Mildly displaced fracture of the lateral wall of the right orbit (for instance as seen on series 3, image 24). These fractures are new as compared to the prior head CT of 10/28/2019. CT CERVICAL SPINE FINDINGS Alignment: Cervical levocurvature. Trace C3-C4 grade 1 anterolisthesis. Skull base and vertebrae: The basion-dental and atlanto-dental intervals are maintained.No evidence of acute fracture to the cervical spine. Soft tissues and spinal canal: No prevertebral fluid or swelling. No visible canal hematoma. Disc levels: Cervical spondylosis with multilevel disc space narrowing, disc bulges, posterior disc osteophytes, uncovertebral hypertrophy and facet arthrosis. Disc space narrowing is advanced at C4-C5, C5-C6 and C6-C7. A posterior disc osteophyte complex at C6-C7 contributes to at least mild spinal canal stenosis. Upper chest: Right apical pleuroparenchymal scarring. No consolidation within the imaged lung apices. No visible pneumothorax. These results were called by telephone at the time of interpretation on 05/02/2020 at 6:51 pm to provider Conni Slipper , who verbally acknowledged these results. IMPRESSION: CT head: 1. Acute subdural hematoma overlying the right frontal operculum and right temporal lobe measuring 5 mm in thickness. Minimal mass effect upon the underlying right frontal lobe. No midline shift. 2. Displaced fractures of the right zygomatic arch and lateral wall of the right orbit, new from the prior head CT of 10/28/2019. Consider a dedicated maxillofacial CT for further evaluation. 3. Moderately advanced cerebral white matter chronic small vessel ischemic disease. 4. Mild cerebral atrophy. 5. Paranasal sinus disease as described. CT cervical spine: 1. No evidence of acute fracture to the cervical spine. 2. Mild C3-C4 grade 1 anterolisthesis. 3. Cervical levocurvature. 4. Cervical spondylosis as described and greatest at C4-C5, C5-C6 and C6-C7.  Electronically Signed   By: Kellie Simmering DO   On: 05/02/2020 18:53   CT Cervical Spine Wo Contrast  Result Date: 05/02/2020 CLINICAL DATA:  Head trauma, minor. Additional history provided: Fall yesterday, bruise to upper eye/right forehead. EXAM: CT HEAD WITHOUT CONTRAST CT CERVICAL SPINE WITHOUT CONTRAST TECHNIQUE: Multidetector CT imaging of the head and cervical spine was performed following the standard protocol without intravenous contrast. Multiplanar CT image reconstructions of the cervical spine were also generated. COMPARISON:  Prior head CT examinations 10/28/2019 and earlier. FINDINGS: CT HEAD FINDINGS Brain: Mild cerebral atrophy. Acute subdural hematoma overlying the right frontal operculum and right temporal lobe measuring 5 mm in thickness (for instance as seen on series 2, image 19) (series 4, image 25). Minimal mass effect upon the underlying right frontal lobe. No midline shift. Moderately advanced ill-defined hypoattenuation within the cerebral white matter is nonspecific, but compatible with chronic small vessel  ischemic disease. No demarcated cortical infarct. No extra-axial fluid collection. No evidence of intracranial mass. Vascular: No hyperdense vessel.  Atherosclerotic calcifications. Skull: Normal. Negative for fracture or focal lesion. Sinuses/Orbits: Visualized orbits show no acute finding. Postsurgical appearance of the ethmoid sinuses with polypoid mucosal thickening. Trace mucosal thickening and small mucous retention cysts within the right maxillary sinus. Other: Displaced fracture of the right zygomatic arch. Mildly displaced fracture of the lateral wall of the right orbit (for instance as seen on series 3, image 24). These fractures are new as compared to the prior head CT of 10/28/2019. CT CERVICAL SPINE FINDINGS Alignment: Cervical levocurvature. Trace C3-C4 grade 1 anterolisthesis. Skull base and vertebrae: The basion-dental and atlanto-dental intervals are maintained.No  evidence of acute fracture to the cervical spine. Soft tissues and spinal canal: No prevertebral fluid or swelling. No visible canal hematoma. Disc levels: Cervical spondylosis with multilevel disc space narrowing, disc bulges, posterior disc osteophytes, uncovertebral hypertrophy and facet arthrosis. Disc space narrowing is advanced at C4-C5, C5-C6 and C6-C7. A posterior disc osteophyte complex at C6-C7 contributes to at least mild spinal canal stenosis. Upper chest: Right apical pleuroparenchymal scarring. No consolidation within the imaged lung apices. No visible pneumothorax. These results were called by telephone at the time of interpretation on 05/02/2020 at 6:51 pm to provider Conni Slipper , who verbally acknowledged these results. IMPRESSION: CT head: 1. Acute subdural hematoma overlying the right frontal operculum and right temporal lobe measuring 5 mm in thickness. Minimal mass effect upon the underlying right frontal lobe. No midline shift. 2. Displaced fractures of the right zygomatic arch and lateral wall of the right orbit, new from the prior head CT of 10/28/2019. Consider a dedicated maxillofacial CT for further evaluation. 3. Moderately advanced cerebral white matter chronic small vessel ischemic disease. 4. Mild cerebral atrophy. 5. Paranasal sinus disease as described. CT cervical spine: 1. No evidence of acute fracture to the cervical spine. 2. Mild C3-C4 grade 1 anterolisthesis. 3. Cervical levocurvature. 4. Cervical spondylosis as described and greatest at C4-C5, C5-C6 and C6-C7. Electronically Signed   By: Kellie Simmering DO   On: 05/02/2020 18:53   CT ABDOMEN PELVIS W CONTRAST  Result Date: 05/02/2020 CLINICAL DATA:  Abdominal abscess/infection suspected Pain in the low abdomen and reports of pain over the buttocks Fall yesterday.  Known decubitus ulcer. EXAM: CT ABDOMEN AND PELVIS WITH CONTRAST TECHNIQUE: Multidetector CT imaging of the abdomen and pelvis was performed using the standard  protocol following bolus administration of intravenous contrast. CONTRAST:  133mL OMNIPAQUE IOHEXOL 300 MG/ML  SOLN COMPARISON:  CT 09/29/2017 FINDINGS: Lower chest: Chronic scarring in the lingula. The minimal subpleural reticulation which appears chronic. No acute airspace disease or pleural effusion. Hepatobiliary: Homogeneous liver attenuation without focal lesion. Innumerable stones fill the gallbladder. There is no pericholecystic edema. There is no biliary dilatation or visualized choledocholithiasis. Pancreas: No ductal dilatation or inflammation. There is a 13 x 9 mm cyst in the pancreatic body, increased in size from prior exam where it measured 9 x 7 mm. Spleen: Normal in size without focal abnormality. Adrenals/Urinary Tract: Normal adrenal glands. No hydronephrosis or perinephric edema. Homogeneous renal enhancement with symmetric excretion on delayed phase imaging. Small low-density lesions in the upper and lower right kidney are too small to characterize but likely small cysts. There is an indeterminate 13 mm lesion in the posterior mid left kidney, unchanged in size in appearance from prior. Foley catheter in the urinary bladder, despite balloon in the bladder lumen the  bladder is distended. There is no perivesicular fat stranding or wall thickening. Stomach/Bowel: Bowel evaluation is limited in the absence of enteric contrast and paucity of intra-abdominal fat. Decompressed stomach. No small bowel obstruction or inflammatory change. The appendix is normal. Diffuse colonic diverticulosis without focal diverticulitis. Enteric sutures noted within the bowel loops in the central abdomen. The sigmoid colon is tortuous. Postoperative changes in the rectosigmoid colon. Collection of air and soft tissue density posterior left rectosigmoid colon with seen on prior exam, series 4, image 22. The air component is diminished, soft tissue component has increased measuring 3.7 cm. There is no definite peripherally  enhancing collection. Presacral edema is again seen. Vascular/Lymphatic: Aortic atherosclerosis. No aortic aneurysm. Patent portal vein. No enlarged lymph nodes in the abdomen or pelvis. Reproductive: Prominent prostate gland. Other: Presacral soft tissue thickening also seen on prior exam. There is no ascites. No free air. Musculoskeletal: There is subcutaneous soft tissue thickening overlying the sacrum with associated skin thickening. Ill-defined areas of internal low density but no peripherally enhancing or drainable fluid collection. There is no definite subjacent bony destruction. Degenerative change in the lower lumbar spine. No acute fracture of the pelvis or spine. IMPRESSION: 1. Skin and soft tissue thickening overlying the sacrum consistent with decubitus ulcer. There is no drainable fluid collection, tracking air or CT findings of sacral osteomyelitis. 2. Postsurgical change in the rectosigmoid colon. There is a chronic perirectal collection of air that has been present dating back to 2019. The air component is diminished from prior, however there is increased surrounding soft tissue thickening. Etiology is indeterminate. This has been previously biopsied. Presacral soft tissue edema is stable. 3. Distended urinary bladder despite Foley catheter in place, recommend correlation with catheter functioning. 4. 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 Recommend further characterization with pancreatic protocol on elective nonemergent basis. 5. Stable size and appearance of hyperattenuating 13 mm lesion in the left kidney. 6. Cholelithiasis without gallbladder inflammation. Aortic Atherosclerosis (ICD10-I70.0). Electronically Signed   By: Keith Rake M.D.   On: 05/02/2020 18:52   CT Maxillofacial Wo Contrast  Result Date: 05/02/2020 CLINICAL DATA:  Facial trauma EXAM: CT MAXILLOFACIAL WITHOUT CONTRAST TECHNIQUE: Multidetector CT imaging of the maxillofacial structures was performed.  Multiplanar CT image reconstructions were also generated. COMPARISON:  Head CT today FINDINGS: Osseous: Fracture through the right zygomatic arch. Mandible is intact. No other facial fracture. Orbits: Fracture through the lateral wall of the right orbit. No other orbital fracture. Sinuses: Mucosal thickening within the maxillary sinuses. No air-fluid levels. Soft tissues: Negative Limited intracranial: See head CT report. IMPRESSION: Fractures through the right lateral orbital wall and right zygomatic arch. No additional facial or orbital fracture. Chronic sinusitis changes. Electronically Signed   By: Rolm Baptise M.D.   On: 05/02/2020 19:41    ____________________________________________   PROCEDURES  Procedure(s) performed (including Critical Care):  Procedures   ____________________________________________   INITIAL IMPRESSION / ASSESSMENT AND PLAN / ED COURSE  Patient has a sed rate of 60 with a 12,000 white count.  The area around the sacrum is firm and tender and reddish.  The sacral decubitus/pilonidal cyst has a necrotic-looking base.  Anticipate we will get this patient in for at least antibiotics.  Dr. Lacinda Axon mentioned that the patient could be at risk for developing an abscess in his subdural hematoma if this is an active infection especially if we address it surgically.    ----------------------------------------- 11:57 PM on 05/02/2020 -----------------------------------------  I will sign this patient  out to oncoming physician attending to admit the patient after the CT scan if the CT scan of the head is unchanged         ____________________________________________   FINAL CLINICAL IMPRESSION(S) / ED DIAGNOSES  Final diagnoses:  Subdural hematoma ([place])  Closed fracture of right zygomatic arch, initial encounter ([place])  Perirectal cellulitis  Wound infection     ED Discharge Orders    None      *Please note:  John Shaw was evaluated in Emergency  Department on 05/02/2020 for the symptoms described in the history of present illness. He was evaluated in the context of the global COVID-19 pandemic, which necessitated consideration that the patient might be at risk for infection with the SARS-CoV-2 virus that causes COVID-19. Institutional protocols and algorithms that pertain to the evaluation of patients at risk for COVID-19 are in a state of rapid change based on information released by regulatory bodies including the CDC and federal and state organizations. These policies and algorithms were followed during the patient's care in the ED.  Some ED evaluations and interventions may be delayed as a result of limited staffing during and the pandemic.*   Note:  This document was prepared using Dragon voice recognition software and may include unintentional dictation errors.    Nena Polio, MD 05/02/20 [phone]

## 2020-05-03 ENCOUNTER — Emergency Department: Payer: Medicare Other

## 2020-05-03 DIAGNOSIS — S0240EA Zygomatic fracture, right side, initial encounter for closed fracture: Secondary | ICD-10-CM | POA: Diagnosis present

## 2020-05-03 DIAGNOSIS — Z85048 Personal history of other malignant neoplasm of rectum, rectosigmoid junction, and anus: Secondary | ICD-10-CM | POA: Diagnosis not present

## 2020-05-03 DIAGNOSIS — R339 Retention of urine, unspecified: Secondary | ICD-10-CM | POA: Diagnosis present

## 2020-05-03 DIAGNOSIS — S0292XA Unspecified fracture of facial bones, initial encounter for closed fracture: Secondary | ICD-10-CM

## 2020-05-03 DIAGNOSIS — S02841A Fracture of lateral orbital wall, right side, initial encounter for closed fracture: Secondary | ICD-10-CM | POA: Diagnosis present

## 2020-05-03 DIAGNOSIS — M545 Low back pain, unspecified: Secondary | ICD-10-CM | POA: Diagnosis present

## 2020-05-03 DIAGNOSIS — L03317 Cellulitis of buttock: Secondary | ICD-10-CM

## 2020-05-03 DIAGNOSIS — E119 Type 2 diabetes mellitus without complications: Secondary | ICD-10-CM | POA: Diagnosis present

## 2020-05-03 DIAGNOSIS — Z20822 Contact with and (suspected) exposure to covid-19: Secondary | ICD-10-CM | POA: Diagnosis present

## 2020-05-03 DIAGNOSIS — R531 Weakness: Secondary | ICD-10-CM | POA: Diagnosis present

## 2020-05-03 DIAGNOSIS — S065XAA Traumatic subdural hemorrhage with loss of consciousness status unknown, initial encounter: Secondary | ICD-10-CM | POA: Insufficient documentation

## 2020-05-03 DIAGNOSIS — I10 Essential (primary) hypertension: Secondary | ICD-10-CM | POA: Diagnosis present

## 2020-05-03 DIAGNOSIS — W19XXXA Unspecified fall, initial encounter: Secondary | ICD-10-CM | POA: Diagnosis present

## 2020-05-03 DIAGNOSIS — Y92009 Unspecified place in unspecified non-institutional (private) residence as the place of occurrence of the external cause: Secondary | ICD-10-CM

## 2020-05-03 DIAGNOSIS — L89153 Pressure ulcer of sacral region, stage 3: Secondary | ICD-10-CM | POA: Diagnosis present

## 2020-05-03 DIAGNOSIS — S065X9A Traumatic subdural hemorrhage with loss of consciousness of unspecified duration, initial encounter: Secondary | ICD-10-CM | POA: Diagnosis present

## 2020-05-03 DIAGNOSIS — R700 Elevated erythrocyte sedimentation rate: Secondary | ICD-10-CM | POA: Diagnosis present

## 2020-05-03 DIAGNOSIS — R338 Other retention of urine: Secondary | ICD-10-CM

## 2020-05-03 DIAGNOSIS — Z79899 Other long term (current) drug therapy: Secondary | ICD-10-CM | POA: Diagnosis not present

## 2020-05-03 LAB — CBG MONITORING, ED
Glucose-Capillary: 119 mg/dL — ABNORMAL HIGH (ref 70–99)
Glucose-Capillary: 95 mg/dL (ref 70–99)
Glucose-Capillary: 100 mg/dL — ABNORMAL HIGH (ref 70–99)
Glucose-Capillary: 157 mg/dL — ABNORMAL HIGH (ref 70–99)
Glucose-Capillary: 145 mg/dL — ABNORMAL HIGH (ref 70–99)

## 2020-05-03 LAB — RESP PANEL BY RT-PCR (FLU A&B, COVID) ARPGX2
Influenza A by PCR: NEGATIVE
Influenza B by PCR: NEGATIVE
SARS Coronavirus 2 by RT PCR: NEGATIVE

## 2020-05-03 LAB — HEMOGLOBIN A1C
Hgb A1c MFr Bld: 5.1 % (ref 4.8–5.6)
Mean Plasma Glucose: 99.67 mg/dL

## 2020-05-03 LAB — GLUCOSE, CAPILLARY: Glucose-Capillary: 145 mg/dL — ABNORMAL HIGH (ref 70–99)

## 2020-05-03 MED ORDER — ACETAMINOPHEN 650 MG RE SUPP: 650.0000 mg | Freq: Four times a day (QID) | RECTAL | Status: DC | PRN

## 2020-05-03 MED ORDER — SODIUM CHLORIDE 0.9% FLUSH
3.0000 mL | Freq: Two times a day (BID) | INTRAVENOUS | Status: DC
  Administered 2020-05-03 – 2020-05-08 (×11): 3 mL via INTRAVENOUS

## 2020-05-03 MED ORDER — SODIUM CHLORIDE 0.9 % IV SOLN
1.0000 g | INTRAVENOUS | Status: DC
  Administered 2020-05-03 – 2020-05-06 (×4): 1 g via INTRAVENOUS
  Filled 2020-05-03 (×2): qty 10
  Filled 2020-05-03 (×2): qty 1
  Filled 2020-05-03 (×2): qty 10

## 2020-05-03 MED ORDER — LORAZEPAM 0.5 MG PO TABS
0.5000 mg | ORAL_TABLET | Freq: Once | ORAL | Status: AC
  Administered 2020-05-03: 03:00:00 0.5 mg via ORAL
  Filled 2020-05-03: qty 1

## 2020-05-03 MED ORDER — ONDANSETRON HCL 4 MG PO TABS: 4.0000 mg | ORAL_TABLET | Freq: Four times a day (QID) | ORAL | Status: DC | PRN

## 2020-05-03 MED ORDER — INSULIN ASPART 100 UNIT/ML ~~LOC~~ SOLN
0.0000 [IU] | Freq: Three times a day (TID) | SUBCUTANEOUS | Status: DC
  Administered 2020-05-03: 14:00:00 2 [IU] via SUBCUTANEOUS
  Administered 2020-05-03 – 2020-05-04 (×2): 1 [IU] via SUBCUTANEOUS
  Administered 2020-05-04: 12:00:00 3 [IU] via SUBCUTANEOUS
  Administered 2020-05-04: 09:00:00 1 [IU] via SUBCUTANEOUS
  Administered 2020-05-06: 09:00:00 2 [IU] via SUBCUTANEOUS
  Administered 2020-05-07: 08:00:00 1 [IU] via SUBCUTANEOUS
  Filled 2020-05-03 (×7): qty 1

## 2020-05-03 MED ORDER — ONDANSETRON HCL 4 MG/2ML IJ SOLN: 4.0000 mg | Freq: Four times a day (QID) | INTRAMUSCULAR | Status: DC | PRN

## 2020-05-03 MED ORDER — CHLORHEXIDINE GLUCONATE CLOTH 2 % EX PADS
6.0000 | MEDICATED_PAD | Freq: Every day | CUTANEOUS | Status: DC
  Administered 2020-05-04 – 2020-05-08 (×5): 6 via TOPICAL

## 2020-05-03 MED ORDER — ACETAMINOPHEN 325 MG PO TABS: 650.0000 mg | ORAL_TABLET | Freq: Four times a day (QID) | ORAL | Status: DC | PRN

## 2020-05-03 MED ORDER — INSULIN ASPART 100 UNIT/ML ~~LOC~~ SOLN: 0.0000 [IU] | Freq: Every day | SUBCUTANEOUS | Status: DC

## 2020-05-03 MED ORDER — HYDROCODONE-ACETAMINOPHEN 5-325 MG PO TABS
1.0000 | ORAL_TABLET | ORAL | Status: DC | PRN
Start: 2020-05-03 — End: 2020-05-08
  Administered 2020-05-06: 1 via ORAL
  Administered 2020-05-07 – 2020-05-08 (×2): 2 via ORAL
  Filled 2020-05-03: qty 2
  Filled 2020-05-03: qty 1
  Filled 2020-05-03 (×2): qty 2

## 2020-05-03 NOTE — ED Notes (Signed)
Patient removed from the bedpan. Patient did not have a bowel movement. New warm blanket give to patient. Door open for safety. Patient is calm, pleasant and cooperative.

## 2020-05-03 NOTE — ED Notes (Signed)
Pt called out to report he felt the urge to have a BM. Pt placed on bedpan at this time

## 2020-05-03 NOTE — ED Notes (Signed)
sacral pressure dressing placed over pt decubitus ulcer

## 2020-05-03 NOTE — ED Notes (Signed)
Pt brought to CPod by Ignatius Specking, RN and Linus Orn, EDT. Pt placed on monitor by Linus Orn, EDT.

## 2020-05-03 NOTE — ED Provider Notes (Signed)
-----------------------------------------   12:15 AM on 05/03/2020 -----------------------------------------  Blood pressure (!) 165/74, pulse 86, temperature 98 F (36.7 C), temperature source Oral, resp. rate (!) 21, height 5\' 7"  (1.702 m), weight 68 kg, SpO2 96 %.  Assuming care from Dr. Cinda Quest.  In short, John Shaw is a 79 y.o. male with a chief complaint of Rectal Pain and Wound Check .  Refer to the original H&P for additional details.  The current plan of care is to follow-up repeat head CT and if stable admit to medicine for sacral ulcer with cellulitis.  ----------------------------------------- 1:11 AM on 05/03/2020 -----------------------------------------  Repeat head CT performed and stable, again shows tiny subdural with no mass-effect.  Patient has received antibiotics and remains hemodynamically stable.  Per initial plan, case was discussed with hospitalist for admission for further treatment of his sacral ulcer with associated cellulitis.   Blake Divine, MD 05/03/20 [phone]

## 2020-05-03 NOTE — Progress Notes (Signed)
Brief hospitalist update note. This is a nonbillable note.  Please see same-day H&P for full billable details.  Briefly, this is a 79 year old male with history significant for diabetes, hypertension, rectal cancer who presented to the emergency room with chief complaint of left buttock pain for past several days.  Also apparently had a unwitnessed fall at home.  CT imaging demonstrated fracture zygomatic arch with evidence of a stable subdural hematoma.  Neurosurgery and ENT were consulted from the emergency room.  Neither 1 recommended inpatient intervention and rather suggested outpatient follow-up post discharge.  Regarding the buttock cellulitis CT imaging did not demonstrate any evidence of fluid collection or abscess.  Patient was started on broad-spectrum IV antibiotics.  His pain is starting to improve.  His level of orientation is also starting to improve.  Had a lengthy conversation with his cousin Tawny Hopping [phone].  The patient lives with this cousin.  She states that his level of mobility has been decreasing.  She encourages him to ambulate at home which she has been reluctant to do so.  She was on able to tell me exactly what the circumstances surrounding his fall were.  I updated her on patient's current status and plan of care.  All questions answered.  Ralene Muskrat MD

## 2020-05-03 NOTE — ED Notes (Addendum)
This RN returned phone call from [facility] [phone] as requested by Smitty Cords after they received phone call, no answer at this time.

## 2020-05-03 NOTE — ED Notes (Signed)
Patient placed back on bed pain, but did not have a BM and was taken off again. Patient remains alert and oriented x2, calm and pleasant. Channel was changed on the TV.

## 2020-05-03 NOTE — Evaluation (Signed)
Physical Therapy Evaluation Patient Details Name: John Shaw MRN: [id number] DOB: 12/02/1940 Today's Date: 05/03/2020   History of Present Illness  79 y.o. male with medical history significant for DM, HTN, and rectal cancer with history of sacral decubitus who presents to the emergency room with complaint of pain in his left buttock for the past several days.  Also fell at home the day prior and hit his head but did not lose consciousness.  Clinical Impression  Pt with some confusion t/o the PT exam, he was able to answer most yes/no questions seemingly appropriately, but was not oriented to time, situation.  He was willing to work with PT but struggled with most aspects of mobility (heavy assist to get to sitting, LOBs backward with static sitting) and needed constant assist to maintain upright in standing (often leaning back of LEs on bed) and could not do more than the most rudimentary side shuffle steps with heavy assist and walker use.  Pt is not safe to transition home even with 24/7 assist and will needs STR to get back to an appropriately functional level to return home.   Follow Up Recommendations SNF    Equipment Recommendations   (TBD)    Recommendations for Other Services       Precautions / Restrictions Precautions Precautions: Fall Restrictions Weight Bearing Restrictions: No      Mobility  Bed Mobility Overal bed mobility: Needs Assistance Bed Mobility: Supine to Sit;Sit to Supine     Supine to sit: Mod assist Sit to supine: Max assist   General bed mobility comments: Pt able to make small move with LEs toward EOB, but was unable to lift trunk or make any real move toward sitting w/o considerable assist.  Similarly he could not lift LEs or start to shift back to supine from sitting and needed max assist to get back into supine.    Transfers Overall transfer level: Needs assistance Equipment used: Rolling walker (2 wheeled) Transfers: Sit to/from  Stand Sit to Stand: Mod assist         General transfer comment: Pt was able to give some effort in getting to standing, but could not do so w/o some direct assist and plenty of cuing for UEs/AD/set up.  Ambulation/Gait             General Gait Details: inappropriate to ambulate, loosing balance multiple times (or leaning back of LEs on bed) with static standing at EOB, managed a few assisted side steps along EOB with poor control, execution and awareness.  Stairs            Wheelchair Mobility    Modified Rankin (Stroke Patients Only)       Balance Overall balance assessment: Needs assistance Sitting-balance support: Bilateral upper extremity supported Sitting balance-Leahy Scale: Poor Sitting balance - Comments: despite assist to square up and appropriately position at EOB he had multiple posterior lean LOBs     Standing balance-Leahy Scale: Poor Standing balance comment: leaning back/unable to shift weight to walker effectively.  Poor execution/tolerance.                             Pertinent Vitals/Pain Pain Assessment:  (minimal soreness in buttock and R side of head)    Home Living Family/patient expects to be discharged to:: Skilled nursing facility                 Additional Comments: Pt hopes to  be able to go home but realizes he needs to get stronger first    Prior Function Level of Independence: Needs assistance   Gait / Transfers Assistance Needed: Pt reports that he does get around the home with walker w/o assist, gets out of home occasionally  ADL's / Homemaking Assistance Needed: Pt reports his cousin helps with ADLs        Hand Dominance        Extremity/Trunk Assessment   Upper Extremity Assessment Upper Extremity Assessment: Generalized weakness (strength appears = bilaterally, lacks elevation > 90 b/l)    Lower Extremity Assessment Lower Extremity Assessment: Generalized weakness (strength grossly = b/l)        Communication   Communication: No difficulties  Cognition Arousal/Alertness: Awake/alert Behavior During Therapy: WFL for tasks assessed/performed Overall Cognitive Status: Difficult to assess                                 General Comments: Pt oriented to self, but does not know date or situation.  Unsure of his baseline but displays some confusion t/o      General Comments      Exercises     Assessment/Plan    PT Assessment Patient needs continued PT services  PT Problem List Decreased strength;Decreased range of motion;Decreased activity tolerance;Decreased balance;Decreased mobility;Decreased coordination;Decreased cognition;Decreased knowledge of use of DME;Decreased safety awareness       PT Treatment Interventions DME instruction;Gait training;Functional mobility training;Therapeutic activities;Therapeutic exercise;Balance training;Neuromuscular re-education;Cognitive remediation;Patient/family education    PT Goals (Current goals can be found in the Care Plan section)  Acute Rehab PT Goals Patient Stated Goal: Pt wants to go home PT Goal Formulation: With patient Time For Goal Achievement: 05/17/20 Potential to Achieve Goals: Fair    Frequency Min 2X/week   Barriers to discharge        Co-evaluation               AM-PAC PT "6 Clicks" Mobility  Outcome Measure Help needed turning from your back to your side while in a flat bed without using bedrails?: A Little Help needed moving from lying on your back to sitting on the side of a flat bed without using bedrails?: A Lot Help needed moving to and from a bed to a chair (including a wheelchair)?: A Lot Help needed standing up from a chair using your arms (e.g., wheelchair or bedside chair)?: A Lot Help needed to walk in hospital room?: Total Help needed climbing 3-5 steps with a railing? : Total 6 Click Score: 11    End of Session Equipment Utilized During Treatment: Gait  belt Activity Tolerance: Patient tolerated treatment well Patient left: with call bell/phone within reach Nurse Communication: Mobility status PT Visit Diagnosis: History of falling (Z91.81);Muscle weakness (generalized) (M62.81);Difficulty in walking, not elsewhere classified (R26.2)    Time: 6734-1937 PT Time Calculation (min) (ACUTE ONLY): 23 min   Charges:   PT Evaluation $PT Eval Low Complexity: 1 Low          Kreg Shropshire, DPT 05/03/2020, 10:24 AM

## 2020-05-03 NOTE — ED Notes (Signed)
Patient was taken off the bedpan. Patient did not have bowel movement.

## 2020-05-03 NOTE — ED Notes (Signed)
Patient placed on bedpan. Patient's mustache and beard were cleaned with wipes.

## 2020-05-03 NOTE — ED Notes (Signed)
Patient states his cousin Tawny Hopping is the person who takes care of him. Patient placed on bedpan. Patient is alert to self and place only, poor memory of recent events.

## 2020-05-03 NOTE — ED Notes (Signed)
Patient cleaned up from lunch, new gown and blanket given.

## 2020-05-03 NOTE — ED Notes (Addendum)
Floor Room is still shown as in the process of being cleaned. Will transport patient when room is ready.

## 2020-05-03 NOTE — H&P (Signed)
History and Physical    Alter Moss HQI:[id number] DOB: 1941-03-08 DOA: 05/02/2020  PCP: Albina Billet, MD   Patient coming from: Home  I have personally briefly reviewed patient's old medical records in [place]  Chief Complaint: Pain, left buttock, fall  HPI: John Shaw is a 79 y.o. male with medical history significant for DM, HTN, and rectal cancer with history of sacral decubitus who presents to the emergency room with complaint of pain in his left buttock for the past several days.  Also fell at home the day prior and hit his head but did not lose consciousness.  Patient denies any fever or chills.  Denies any oozing from the wound.  Denies any headache visual disturbance one-sided weakness numbness or tingling. ED Course: On arrival in the ER, afebrile, BP 168/83, pulse 85 O2 sat 100% on room air.  Blood work significant for leukocytosis of12400, but otherwise unremarkable except for elevated sed rate of 60.  Urinalysis unremarkable.  A bladder scan was done while in the ER and patient was found to have 500 mils urine  Patient was noted to have a bruise under the left eye and underwent extensive imaging that showed an acute subdural hematoma measuring 5 mm with minimal mass-effect on the right frontal lobe as well as a displaced fracture of the right zygomatic arch and lateral wall of the right orbit.  Repeat CT head in 6 hours showed no change to the size of the subdural hematoma The emergency room provider spoke with ENT who recommended outpatient follow-up for the facial bone fracture.  Also spoke with neurosurgeon, Dr. Lacinda Axon who recommended nothing further to do given stability of subdural hematoma after 6 hours. Hospitalist consulted for admission Review of Systems: As per HPI otherwise all other systems on review of systems negative.    Past Medical History:  Diagnosis Date  . Diabetes mellitus without complication ([place])   . Hypertension   . Rectal  cancer ([place]) 2014    Past Surgical History:  Procedure Laterality Date  . COLONOSCOPY  05/31/2014  . COLONOSCOPY WITH PROPOFOL N/A 09/03/2017   Procedure: COLONOSCOPY WITH PROPOFOL;  Surgeon: Robert Bellow, MD;  Location: [facility] ENDOSCOPY;  Service: Endoscopy;  Laterality: N/A;  . ROBOT ASSISTED LAPAROSCOPIC PARTIAL COLECTOMY  2016   Dr Audie Clear     reports that he has never smoked. He has never used smokeless tobacco. He reports that he does not drink alcohol and does not use drugs.  No Known Allergies  Family History  Problem Relation Age of Onset  . Colon cancer Neg Hx       Prior to Admission medications   Medication Sig Start Date End Date Taking? Authorizing Provider  lidocaine (LIDODERM) 5 % Place 1 patch onto the skin every 12 (twelve) hours. Remove & Discard patch within 12 hours or as directed by MD 02/06/20 02/05/21  Laban Emperor, PA-C  Melatonin 1 MG CAPS Take by mouth at bedtime.     [provider]    Physical Exam: Vitals:   05/02/20 2035 05/02/20 2223 05/03/20 0012 05/03/20 0250  BP: (!) 145/74 (!) 174/92 (!) 165/74 (!) 182/95  Pulse: (!) 107 80 86 91  Resp: (!) 21 19 (!) 21   Temp: 98 F (36.7 C)  98 F (36.7 C)   TempSrc: Oral  Oral   SpO2: 96% 97% 96% 96%  Weight:      Height:         Vitals:  05/02/20 2035 05/02/20 2223 05/03/20 0012 05/03/20 0250  BP: (!) 145/74 (!) 174/92 (!) 165/74 (!) 182/95  Pulse: (!) 107 80 86 91  Resp: (!) 21 19 (!) 21   Temp: 98 F (36.7 C)  98 F (36.7 C)   TempSrc: Oral  Oral   SpO2: 96% 97% 96% 96%  Weight:      Height:          Constitutional: Alert and oriented x 3 . Not in any apparent distress HEENT:      Head: Normocephalic, ecchymosis right lower eyelid        Eyes: PERLA, EOMI, Conjunctivae are normal. Sclera is non-icteric.       Mouth/Throat: Mucous membranes are moist.       Neck: Supple with no signs of meningismus. Cardiovascular: Regular rate and rhythm. No murmurs, gallops, or  rubs. 2+ symmetrical distal pulses are present . No JVD. No LE edema Respiratory: Respiratory effort normal .Lungs sounds clear bilaterally. No wheezes, crackles, or rhonchi.  Gastrointestinal: Soft, non tender, and non distended with positive bowel sounds.  Genitourinary: No CVA tenderness. Musculoskeletal: Nontender with normal range of motion in all extremities. No cyanosis, or erythema of extremities. Neurologic:  Face is symmetric. Moving all extremities. No gross focal neurologic deficits . Skin: Skin is warm, dry.  Sacral ulcer 3 cm diameter with fat layer exposed over intergluteal fold psychiatric: Mood and affect are normal    Labs on Admission: I have personally reviewed following labs and imaging studies  CBC: Recent Labs  Lab 05/02/20 1617  WBC 12.4*  NEUTROABS 10.4*  HGB 13.2  HCT 39.3  MCV 93.1  PLT 010   Basic Metabolic Panel: Recent Labs  Lab 05/02/20 1617  NA 137  K 3.7  CL 103  CO2 24  GLUCOSE 118*  BUN 17  CREATININE 1.06  CALCIUM 8.8*   GFR: Estimated Creatinine Clearance: 54.6 mL/min (by C-G formula based on SCr of 1.06 mg/dL). Liver Function Tests: No results for input(s): AST, ALT, ALKPHOS, BILITOT, PROT, ALBUMIN in the last 168 hours. No results for input(s): LIPASE, AMYLASE in the last 168 hours. No results for input(s): AMMONIA in the last 168 hours. Coagulation Profile: No results for input(s): INR, PROTIME in the last 168 hours. Cardiac Enzymes: No results for input(s): CKTOTAL, CKMB, CKMBINDEX, TROPONINI in the last 168 hours. BNP (last 3 results) No results for input(s): PROBNP in the last 8760 hours. HbA1C: No results for input(s): HGBA1C in the last 72 hours. CBG: Recent Labs  Lab 05/03/20 0138  GLUCAP 119*   Lipid Profile: No results for input(s): CHOL, HDL, LDLCALC, TRIG, CHOLHDL, LDLDIRECT in the last 72 hours. Thyroid Function Tests: No results for input(s): TSH, T4TOTAL, FREET4, T3FREE, THYROIDAB in the last 72  hours. Anemia Panel: No results for input(s): VITAMINB12, FOLATE, FERRITIN, TIBC, IRON, RETICCTPCT in the last 72 hours. Urine analysis:    Component Value Date/Time   COLORURINE YELLOW (A) 05/02/2020 1735   APPEARANCEUR CLEAR (A) 05/02/2020 1735   APPEARANCEUR Clear 02/02/2013 1001   LABSPEC 1.018 05/02/2020 1735   LABSPEC 1.010 02/02/2013 1001   PHURINE 6.0 05/02/2020 1735   GLUCOSEU 50 (A) 05/02/2020 1735   GLUCOSEU Negative 02/02/2013 1001   HGBUR SMALL (A) 05/02/2020 1735   BILIRUBINUR NEGATIVE 05/02/2020 1735   BILIRUBINUR Negative 02/02/2013 1001   KETONESUR 5 (A) 05/02/2020 1735   PROTEINUR NEGATIVE 05/02/2020 1735   NITRITE NEGATIVE 05/02/2020 1735   LEUKOCYTESUR NEGATIVE 05/02/2020 1735   LEUKOCYTESUR  Negative 02/02/2013 1001    Radiological Exams on Admission: CT Head Wo Contrast  Result Date: 05/03/2020 CLINICAL DATA:  Subdural hematoma, follow-up examination EXAM: CT HEAD WITHOUT CONTRAST TECHNIQUE: Contiguous axial images were obtained from the base of the skull through the vertex without intravenous contrast. COMPARISON:  05/02/2020 FINDINGS: Brain: Tiny subdural hematoma seen just above the right middle cranial fossa superficial to the frontal operculum is stable. No interval acute intracranial hemorrhage. Mild parenchymal volume loss is again noted, commensurate with the patient's age. Moderate periventricular white matter changes are present likely reflecting the sequela of small vessel ischemia. No abnormal mass effect or midline shift. No abnormal intra or extra-axial mass lesion. No evidence of acute infarct. Ventricular size is normal. Cerebellum is unremarkable. Vascular: No asymmetric hyperdense vasculature noted at the skull base. Skull: Intact. Sinuses/Orbits: Moderate mucosal thickening is noted throughout the ethmoid air cells bilaterally. Bilateral middle turbinectomy has been performed. No air-fluid levels are identified. Remaining paranasal sinuses are  clear. Orbits are unremarkable. Other: Remote fracture of the right zygomatic bone noted. Mastoid air cells and middle ear cavities are clear. IMPRESSION: Stable tiny right subdural hematoma. No significant associated mass effect. No interval hemorrhage. Electronically Signed   By: Fidela Salisbury MD   On: 05/03/2020 00:40   CT HEAD WO CONTRAST  Result Date: 05/02/2020 CLINICAL DATA:  Head trauma, minor. Additional history provided: Fall yesterday, bruise to upper eye/right forehead. EXAM: CT HEAD WITHOUT CONTRAST CT CERVICAL SPINE WITHOUT CONTRAST TECHNIQUE: Multidetector CT imaging of the head and cervical spine was performed following the standard protocol without intravenous contrast. Multiplanar CT image reconstructions of the cervical spine were also generated. COMPARISON:  Prior head CT examinations 10/28/2019 and earlier. FINDINGS: CT HEAD FINDINGS Brain: Mild cerebral atrophy. Acute subdural hematoma overlying the right frontal operculum and right temporal lobe measuring 5 mm in thickness (for instance as seen on series 2, image 19) (series 4, image 25). Minimal mass effect upon the underlying right frontal lobe. No midline shift. Moderately advanced ill-defined hypoattenuation within the cerebral white matter is nonspecific, but compatible with chronic small vessel ischemic disease. No demarcated cortical infarct. No extra-axial fluid collection. No evidence of intracranial mass. Vascular: No hyperdense vessel.  Atherosclerotic calcifications. Skull: Normal. Negative for fracture or focal lesion. Sinuses/Orbits: Visualized orbits show no acute finding. Postsurgical appearance of the ethmoid sinuses with polypoid mucosal thickening. Trace mucosal thickening and small mucous retention cysts within the right maxillary sinus. Other: Displaced fracture of the right zygomatic arch. Mildly displaced fracture of the lateral wall of the right orbit (for instance as seen on series 3, image 24). These fractures  are new as compared to the prior head CT of 10/28/2019. CT CERVICAL SPINE FINDINGS Alignment: Cervical levocurvature. Trace C3-C4 grade 1 anterolisthesis. Skull base and vertebrae: The basion-dental and atlanto-dental intervals are maintained.No evidence of acute fracture to the cervical spine. Soft tissues and spinal canal: No prevertebral fluid or swelling. No visible canal hematoma. Disc levels: Cervical spondylosis with multilevel disc space narrowing, disc bulges, posterior disc osteophytes, uncovertebral hypertrophy and facet arthrosis. Disc space narrowing is advanced at C4-C5, C5-C6 and C6-C7. A posterior disc osteophyte complex at C6-C7 contributes to at least mild spinal canal stenosis. Upper chest: Right apical pleuroparenchymal scarring. No consolidation within the imaged lung apices. No visible pneumothorax. These results were called by telephone at the time of interpretation on 05/02/2020 at 6:51 pm to provider Conni Slipper , who verbally acknowledged these results. IMPRESSION: CT head: 1. Acute subdural  hematoma overlying the right frontal operculum and right temporal lobe measuring 5 mm in thickness. Minimal mass effect upon the underlying right frontal lobe. No midline shift. 2. Displaced fractures of the right zygomatic arch and lateral wall of the right orbit, new from the prior head CT of 10/28/2019. Consider a dedicated maxillofacial CT for further evaluation. 3. Moderately advanced cerebral white matter chronic small vessel ischemic disease. 4. Mild cerebral atrophy. 5. Paranasal sinus disease as described. CT cervical spine: 1. No evidence of acute fracture to the cervical spine. 2. Mild C3-C4 grade 1 anterolisthesis. 3. Cervical levocurvature. 4. Cervical spondylosis as described and greatest at C4-C5, C5-C6 and C6-C7. Electronically Signed   By: Kellie Simmering DO   On: 05/02/2020 18:53   CT Cervical Spine Wo Contrast  Result Date: 05/02/2020 CLINICAL DATA:  Head trauma, minor. Additional  history provided: Fall yesterday, bruise to upper eye/right forehead. EXAM: CT HEAD WITHOUT CONTRAST CT CERVICAL SPINE WITHOUT CONTRAST TECHNIQUE: Multidetector CT imaging of the head and cervical spine was performed following the standard protocol without intravenous contrast. Multiplanar CT image reconstructions of the cervical spine were also generated. COMPARISON:  Prior head CT examinations 10/28/2019 and earlier. FINDINGS: CT HEAD FINDINGS Brain: Mild cerebral atrophy. Acute subdural hematoma overlying the right frontal operculum and right temporal lobe measuring 5 mm in thickness (for instance as seen on series 2, image 19) (series 4, image 25). Minimal mass effect upon the underlying right frontal lobe. No midline shift. Moderately advanced ill-defined hypoattenuation within the cerebral white matter is nonspecific, but compatible with chronic small vessel ischemic disease. No demarcated cortical infarct. No extra-axial fluid collection. No evidence of intracranial mass. Vascular: No hyperdense vessel.  Atherosclerotic calcifications. Skull: Normal. Negative for fracture or focal lesion. Sinuses/Orbits: Visualized orbits show no acute finding. Postsurgical appearance of the ethmoid sinuses with polypoid mucosal thickening. Trace mucosal thickening and small mucous retention cysts within the right maxillary sinus. Other: Displaced fracture of the right zygomatic arch. Mildly displaced fracture of the lateral wall of the right orbit (for instance as seen on series 3, image 24). These fractures are new as compared to the prior head CT of 10/28/2019. CT CERVICAL SPINE FINDINGS Alignment: Cervical levocurvature. Trace C3-C4 grade 1 anterolisthesis. Skull base and vertebrae: The basion-dental and atlanto-dental intervals are maintained.No evidence of acute fracture to the cervical spine. Soft tissues and spinal canal: No prevertebral fluid or swelling. No visible canal hematoma. Disc levels: Cervical spondylosis  with multilevel disc space narrowing, disc bulges, posterior disc osteophytes, uncovertebral hypertrophy and facet arthrosis. Disc space narrowing is advanced at C4-C5, C5-C6 and C6-C7. A posterior disc osteophyte complex at C6-C7 contributes to at least mild spinal canal stenosis. Upper chest: Right apical pleuroparenchymal scarring. No consolidation within the imaged lung apices. No visible pneumothorax. These results were called by telephone at the time of interpretation on 05/02/2020 at 6:51 pm to provider Conni Slipper , who verbally acknowledged these results. IMPRESSION: CT head: 1. Acute subdural hematoma overlying the right frontal operculum and right temporal lobe measuring 5 mm in thickness. Minimal mass effect upon the underlying right frontal lobe. No midline shift. 2. Displaced fractures of the right zygomatic arch and lateral wall of the right orbit, new from the prior head CT of 10/28/2019. Consider a dedicated maxillofacial CT for further evaluation. 3. Moderately advanced cerebral white matter chronic small vessel ischemic disease. 4. Mild cerebral atrophy. 5. Paranasal sinus disease as described. CT cervical spine: 1. No evidence of acute fracture to the  cervical spine. 2. Mild C3-C4 grade 1 anterolisthesis. 3. Cervical levocurvature. 4. Cervical spondylosis as described and greatest at C4-C5, C5-C6 and C6-C7. Electronically Signed   By: Kellie Simmering DO   On: 05/02/2020 18:53   CT ABDOMEN PELVIS W CONTRAST  Result Date: 05/02/2020 CLINICAL DATA:  Abdominal abscess/infection suspected Pain in the low abdomen and reports of pain over the buttocks Fall yesterday.  Known decubitus ulcer. EXAM: CT ABDOMEN AND PELVIS WITH CONTRAST TECHNIQUE: Multidetector CT imaging of the abdomen and pelvis was performed using the standard protocol following bolus administration of intravenous contrast. CONTRAST:  115mL OMNIPAQUE IOHEXOL 300 MG/ML  SOLN COMPARISON:  CT 09/29/2017 FINDINGS: Lower chest: Chronic  scarring in the lingula. The minimal subpleural reticulation which appears chronic. No acute airspace disease or pleural effusion. Hepatobiliary: Homogeneous liver attenuation without focal lesion. Innumerable stones fill the gallbladder. There is no pericholecystic edema. There is no biliary dilatation or visualized choledocholithiasis. Pancreas: No ductal dilatation or inflammation. There is a 13 x 9 mm cyst in the pancreatic body, increased in size from prior exam where it measured 9 x 7 mm. Spleen: Normal in size without focal abnormality. Adrenals/Urinary Tract: Normal adrenal glands. No hydronephrosis or perinephric edema. Homogeneous renal enhancement with symmetric excretion on delayed phase imaging. Small low-density lesions in the upper and lower right kidney are too small to characterize but likely small cysts. There is an indeterminate 13 mm lesion in the posterior mid left kidney, unchanged in size in appearance from prior. Foley catheter in the urinary bladder, despite balloon in the bladder lumen the bladder is distended. There is no perivesicular fat stranding or wall thickening. Stomach/Bowel: Bowel evaluation is limited in the absence of enteric contrast and paucity of intra-abdominal fat. Decompressed stomach. No small bowel obstruction or inflammatory change. The appendix is normal. Diffuse colonic diverticulosis without focal diverticulitis. Enteric sutures noted within the bowel loops in the central abdomen. The sigmoid colon is tortuous. Postoperative changes in the rectosigmoid colon. Collection of air and soft tissue density posterior left rectosigmoid colon with seen on prior exam, series 4, image 22. The air component is diminished, soft tissue component has increased measuring 3.7 cm. There is no definite peripherally enhancing collection. Presacral edema is again seen. Vascular/Lymphatic: Aortic atherosclerosis. No aortic aneurysm. Patent portal vein. No enlarged lymph nodes in the  abdomen or pelvis. Reproductive: Prominent prostate gland. Other: Presacral soft tissue thickening also seen on prior exam. There is no ascites. No free air. Musculoskeletal: There is subcutaneous soft tissue thickening overlying the sacrum with associated skin thickening. Ill-defined areas of internal low density but no peripherally enhancing or drainable fluid collection. There is no definite subjacent bony destruction. Degenerative change in the lower lumbar spine. No acute fracture of the pelvis or spine. IMPRESSION: 1. Skin and soft tissue thickening overlying the sacrum consistent with decubitus ulcer. There is no drainable fluid collection, tracking air or CT findings of sacral osteomyelitis. 2. Postsurgical change in the rectosigmoid colon. There is a chronic perirectal collection of air that has been present dating back to 2019. The air component is diminished from prior, however there is increased surrounding soft tissue thickening. Etiology is indeterminate. This has been previously biopsied. Presacral soft tissue edema is stable. 3. Distended urinary bladder despite Foley catheter in place, recommend correlation with catheter functioning. 4. 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 Recommend further characterization with pancreatic protocol on elective nonemergent basis. 5. Stable size and appearance of hyperattenuating 13 mm lesion in the  left kidney. 6. Cholelithiasis without gallbladder inflammation. Aortic Atherosclerosis (ICD10-I70.0). Electronically Signed   By: Keith Rake M.D.   On: 05/02/2020 18:52   CT Maxillofacial Wo Contrast  Result Date: 05/02/2020 CLINICAL DATA:  Facial trauma EXAM: CT MAXILLOFACIAL WITHOUT CONTRAST TECHNIQUE: Multidetector CT imaging of the maxillofacial structures was performed. Multiplanar CT image reconstructions were also generated. COMPARISON:  Head CT today FINDINGS: Osseous: Fracture through the right zygomatic arch. Mandible is intact. No other  facial fracture. Orbits: Fracture through the lateral wall of the right orbit. No other orbital fracture. Sinuses: Mucosal thickening within the maxillary sinuses. No air-fluid levels. Soft tissues: Negative Limited intracranial: See head CT report. IMPRESSION: Fractures through the right lateral orbital wall and right zygomatic arch. No additional facial or orbital fracture. Chronic sinusitis changes. Electronically Signed   By: Rolm Baptise M.D.   On: 05/02/2020 19:41     Assessment/Plan 79 year old male with history of DM, HTN, and rectal cancer with history of sacral decubitus who presents to the emergency room with complaint of pain in his left buttock for the past several days.  Also had a fall at home.  Acute urinary retention in the ER.   Possible cellulitis of buttock Painful sacral decubitus ulcer, stage III (HCC) -CT abdomen and pelvisSkin and soft tissue thickening overlying the sacrum consistent with decubitus ulcer. There is no drainable fluid collection, tracking air or CT findings of sacral osteomyelitis -Rocephin -Wound care -Decubitus precautions    Acute urinary retention -Bladder scan with 500 mils -Flomax -Continue Foley -Urology consult in the a.m.    Fall at home, initial encounter Multiple facial bone fracture [facility]) -Outpatient ENT referral -Pain management    Subdural hematoma without coma ([place]) -CT head x2 6 hours apart with no change -Neurologic checks -Neurosurgery was consulted from the ER and no further follow-up recommended    Diabetes mellitus without complication ([place]) -Sliding scale insulin    Hypertension -Continue home meds    DVT prophylaxis: SCDs due to subdural hematoma Code Status: full code  Family Communication:  none  Disposition Plan: Back to previous home environment Consults called: none  Status: Observation    Athena Masse MD [facility]     05/03/2020, 3:26 AM

## 2020-05-03 NOTE — ED Notes (Addendum)
This RN spoke with Levada Dy [facility]) [phone]. This RN explained to patient's cousin that documented legal guardian is Berlinda Last, per Levada Dy pt's legal guardian has dementia and they are currently in the process of having guardianship paperwork changed. This RN explained that due to patient having a legal guardian documented, unable to give specific medical information out over the phone however reassured that patient is resting with eyes closed and is being admitted and would be willing to dicuss when she arrives later in the day. Levada Dy also requesting this RN's name and Charge RN's name, this RN provided both, Levada Dy also requesting Charge RN's last name, this RN explained that due to safety concerns for staff, could not provide charge RN's last name at this time.

## 2020-05-03 NOTE — Consult Note (Signed)
[place] Nurse Consult Note: Patient receiving care in [facility] [id number] Consult completed remotely after review of chart and [place] to bedside RN Reason for Consult: Buttock cellulitis Wound type: Stage 3 sacral wound Pressure Injury POA: Yes Measurement: to be completed by bedside RN and documented on flowsheet Wound bed: per bedside RN patient has stage 3 to his buttocks with no eschar. Pink/yellow moist wound bed. Drainage (amount, consistency, odor) Moderate. Dressing procedure/placement/frequency: Cleanse sacral wound with no rinse cleaner, pat dry. Apply Aquacel Advantage Kellie Simmering # [phone]) over the wound and secure with foam dressing. Change daily.   Monitor the wound area(s) for worsening of condition such as: Signs/symptoms of infection, increase in size, development of or worsening of odor, development of pain, or increased pain at the affected locations.   Notify the medical team if any of these develop.  Thank you for the consult. [place] nurse will not follow at this time.   Please re-consult the [place] team if needed.  Cathlean Marseilles Tamala Julian, MSN, RN, [place], Lysle Pearl, [facility] Wound Treatment Associate Pager [phone]

## 2020-05-03 NOTE — ED Notes (Signed)
This RN received return phone call from [facility] [phone], per Abran Duke she states she is a "concerned citizen and patient advocate" for this patient and the boss of the patient's cousin Levada Dy, states concerns regarding why the "family is being shut out", per Anne Ng, she is "not afraid to call news 2". This RN verified phone number with Abran Duke for return phone call.

## 2020-05-04 LAB — BASIC METABOLIC PANEL
Anion gap: 9 (ref 5–15)
BUN: 24 mg/dL — ABNORMAL HIGH (ref 8–23)
CO2: 22 mmol/L (ref 22–32)
Calcium: 9 mg/dL (ref 8.9–10.3)
Chloride: 107 mmol/L (ref 98–111)
Creatinine, Ser: 0.91 mg/dL (ref 0.61–1.24)
GFR, Estimated: >60 mL/min (ref >60)
Glucose, Bld: 135 mg/dL — ABNORMAL HIGH (ref 70–99)
Potassium: 3.9 mmol/L (ref 3.5–5.1)
Sodium: 138 mmol/L (ref 135–145)

## 2020-05-04 LAB — GLUCOSE, CAPILLARY
Glucose-Capillary: 121 mg/dL — ABNORMAL HIGH (ref 70–99)
Glucose-Capillary: 125 mg/dL — ABNORMAL HIGH (ref 70–99)
Glucose-Capillary: 222 mg/dL — ABNORMAL HIGH (ref 70–99)
Glucose-Capillary: 121 mg/dL — ABNORMAL HIGH (ref 70–99)
Glucose-Capillary: 125 mg/dL — ABNORMAL HIGH (ref 70–99)

## 2020-05-04 LAB — CBC WITH DIFFERENTIAL/PLATELET
Abs Immature Granulocytes: 0.01 10*3/uL (ref 0.00–0.07)
Basophils Absolute: 0 10*3/uL (ref 0.0–0.1)
Basophils Relative: 0 %
Eosinophils Absolute: 0.1 10*3/uL (ref 0.0–0.5)
Eosinophils Relative: 1 %
HCT: 39.7 % (ref 39.0–52.0)
Hemoglobin: 13.7 g/dL (ref 13.0–17.0)
Immature Granulocytes: 0 %
Lymphocytes Relative: 12 %
Lymphs Abs: 0.9 10*3/uL (ref 0.7–4.0)
MCH: 31.6 pg (ref 26.0–34.0)
MCHC: 34.5 g/dL (ref 30.0–36.0)
MCV: 91.7 fL (ref 80.0–100.0)
Monocytes Absolute: 0.6 10*3/uL (ref 0.1–1.0)
Monocytes Relative: 8 %
Neutro Abs: 6.1 10*3/uL (ref 1.7–7.7)
Neutrophils Relative %: 79 %
Platelets: 209 10*3/uL (ref 150–400)
RBC: 4.33 MIL/uL (ref 4.22–5.81)
RDW: 12.9 % (ref 11.5–15.5)
WBC: 7.6 10*3/uL (ref 4.0–10.5)
nRBC: 0 % (ref 0.0–0.2)

## 2020-05-04 LAB — MRSA PCR SCREENING: MRSA by PCR: NEGATIVE

## 2020-05-04 MED ORDER — MELATONIN 5 MG PO TABS
5.0000 mg | ORAL_TABLET | Freq: Every day | ORAL | Status: DC
  Administered 2020-05-04 – 2020-05-07 (×4): 5 mg via ORAL
  Filled 2020-05-04 (×4): qty 1

## 2020-05-04 MED ORDER — HYDROXYZINE HCL 10 MG PO TABS
10.0000 mg | ORAL_TABLET | Freq: Four times a day (QID) | ORAL | Status: DC | PRN
  Filled 2020-05-04 (×3): qty 1

## 2020-05-04 MED ORDER — ENOXAPARIN SODIUM 40 MG/0.4ML ~~LOC~~ SOLN
40.0000 mg | SUBCUTANEOUS | Status: DC
  Administered 2020-05-04 – 2020-05-07 (×4): 40 mg via SUBCUTANEOUS
  Filled 2020-05-04 (×4): qty 0.4

## 2020-05-04 NOTE — Care Management (Addendum)
Patient alert to self. Voicemail left for both contacts listed in chart to discussed SNF recommendation.  Awaiting return call   305 pm --- Called numbers listed again and left message.  Updated records to include additional contact number for [facility]

## 2020-05-04 NOTE — Progress Notes (Signed)
Foley cath removed at this time.

## 2020-05-04 NOTE — TOC Progression Note (Signed)
Transition of Care [facility]) - Progression Note    Patient Details  Name: John Shaw MRN: [id number] Date of Birth: [birth year]  Transition of Care [facility]) CM/SW Contact  Beverly Sessions, RN Phone Number: 05/04/2020, 4:18 PM  Clinical Narrative:     Received call back from 2nd cousin Levada Dy.  Per Levada Dy, her father Mr Nehemiah Settle is patient's 1st cousin and was the patient's POA.  However he now has dementia and sundowns and is confused.    Levada Dy is the patient's next of kin, and live in care giver.   PCP [facility] states that he was not on any medications prior to admission Baseline can ambulate with RW  PT has assessed patient and recommends SNF.  Levada Dy is in agreement for bed search. Her goal is for rehab then him return home.   Patient is alert to self.  Levada Dy would like to be the one to talk to the patient about SNF,  She said it would come better from her.  She will visit with the patient tonight.   Existing PASRR Fl2 sent for signature Bedsearch initiated        Expected Discharge Plan and Services                                                 Social Determinants of Health (SDOH) Interventions    Readmission Risk Interventions No flowsheet data found.

## 2020-05-04 NOTE — NC FL2 (Signed)
[place] LEVEL OF CARE SCREENING TOOL     IDENTIFICATION  Patient Name: John Shaw Birthdate: [birth year] Sex: male Admission Date (Current Location): 05/02/2020  [facility] and [place] Number:  Engineering geologist and Address:         Provider Number: [id number]  Attending Physician Name and Address:  Sidney Ace, MD  Relative Name and Phone Number:       Current Level of Care: Hospital Recommended Level of Care: [place] Prior Approval Number:    Date Approved/Denied:   PASRR Number: [id number]  Discharge Plan: SNF    Current Diagnoses: Patient Active Problem List   Diagnosis Date Noted  . Sacral decubitus ulcer, stage III ([place]) 05/03/2020  . Acute urinary retention 05/03/2020  . Fall at home, initial encounter 05/03/2020  . Facial bone fracture ([place]) 05/03/2020  . Subdural hematoma without coma ([place]) 05/03/2020  . Cellulitis of buttock 05/03/2020  . Subdural hematoma ([place]) 05/03/2020  . Diabetes ([place]) 09/07/2013  . Diabetes mellitus without complication ([place])   . Hypertension   . Rectal cancer ([place])     Orientation RESPIRATION BLADDER Height & Weight     Self  Normal Indwelling catheter Weight: 59 kg Height:  5\' 7"  (170.2 cm)  BEHAVIORAL SYMPTOMS/MOOD NEUROLOGICAL BOWEL NUTRITION STATUS      Continent Diet (Carb modified)  AMBULATORY STATUS COMMUNICATION OF NEEDS Skin   Extensive Assist Verbally PU Stage and Appropriate Care,Skin abrasions,Bruising                       Personal Care Assistance Level of Assistance              Functional Limitations Info             SPECIAL CARE FACTORS FREQUENCY  OT (By licensed OT),PT (By licensed PT)                    Contractures Contractures Info: Not present    Additional Factors Info  Code Status,Allergies Code Status Info: Full Allergies Info: NKDA           Current Medications (05/04/2020):  This is the current hospital active  medication list Current Facility-Administered Medications  Medication Dose Route Frequency Provider Last Rate Last Admin  . acetaminophen (TYLENOL) tablet 650 mg  650 mg Oral Q6H PRN Athena Masse, MD       Or  . acetaminophen (TYLENOL) suppository 650 mg  650 mg Rectal Q6H PRN Athena Masse, MD      . cefTRIAXone (ROCEPHIN) 1 g in sodium chloride 0.9 % 100 mL IVPB  1 g Intravenous Q24H Judd Gaudier V, MD 200 mL/hr at 05/04/20 0112 1 g at 05/04/20 0112  . Chlorhexidine Gluconate Cloth 2 % PADS 6 each  6 each Topical Daily Sidney Ace, MD   6 each at 05/04/20 [id number]  . enoxaparin (LOVENOX) injection 40 mg  40 mg Subcutaneous Q24H Ralene Muskrat B, MD   40 mg at 05/04/20 1457  . HYDROcodone-acetaminophen (NORCO/VICODIN) 5-325 MG per tablet 1-2 tablet  1-2 tablet Oral Q4H PRN Athena Masse, MD      . insulin aspart (novoLOG) injection 0-5 Units  0-5 Units Subcutaneous QHS Judd Gaudier V, MD      . insulin aspart (novoLOG) injection 0-9 Units  0-9 Units Subcutaneous TID WC Athena Masse, MD   3 Units at 05/04/20 1217  . ondansetron (ZOFRAN) tablet 4 mg  4 mg Oral Q6H PRN Athena Masse, MD       Or  . ondansetron [facility]) injection 4 mg  4 mg Intravenous Q6H PRN Judd Gaudier V, MD      . sodium chloride flush (NS) 0.9 % injection 3 mL  3 mL Intravenous Q12H Athena Masse, MD   3 mL at 05/04/20 3414     Discharge Medications: Please see discharge summary for a list of discharge medications.  Relevant Imaging Results:  Relevant Lab Results:   Additional Information ss [id number]  Beverly Sessions, RN

## 2020-05-04 NOTE — Progress Notes (Signed)
PROGRESS NOTE    John Shaw  PYP:[id number] DOB: 17-Jun-1940 DOA: 05/02/2020 PCP: Albina Billet, MD   Brief Narrative:  79 year old male with history significant for diabetes, hypertension, rectal cancer who presented to the emergency room with chief complaint of left buttock pain for past several days.  Also apparently had a unwitnessed fall at home.  CT imaging demonstrated fracture zygomatic arch with evidence of a stable subdural hematoma.  Neurosurgery and ENT were consulted from the emergency room.  Neither 1 recommended inpatient intervention and rather suggested outpatient follow-up post discharge.  Regarding the buttock cellulitis CT imaging did not demonstrate any evidence of fluid collection or abscess.  Patient was started on broad-spectrum IV antibiotics.  His pain is starting to improve.  His level of orientation is also starting to improve.  Had a lengthy conversation with his cousin Tawny Hopping [phone].  The patient lives with this cousin.  She states that his level of mobility has been decreasing.  She encourages him to ambulate at home which she has been reluctant to do so.  She was on able to tell me exactly what the circumstances surrounding his fall were.  12/16: Mentation and pain control improving.  Patient intermittently tearful    Assessment & Plan:   Principal Problem:   Cellulitis of buttock Active Problems:   Diabetes mellitus without complication ([place])   Hypertension   Sacral decubitus ulcer, stage III ([place])   Acute urinary retention   Fall at home, initial encounter   Facial bone fracture ([facility])   Subdural hematoma without coma ([facility])    Possible cellulitis of buttock Painful sacral decubitus ulcer, stage III ([facility]) -CT abdomen and pelvis demonstrating skin and soft tissue thickening overlying the sacrum consistent with decubitus ulcer. There is no drainable fluid collection, tracking air or CT findings of sacral  osteomyelitis -Rocephin -Wound care -Decubitus precautions    Acute urinary retention Patient was bladder scanned in ED with 500 cc.  Foley catheter was placed presumably without attempted in and out catheterization. Plan: Voiding trial DC Foley  bladder scan every 6 hours Straight catheterization as needed for bladder volume greater than 350 cc    Fall at home, initial encounter Multiple facial bone fracture ([place]) -Outpatient ENT referral -Pain management    Subdural hematoma without coma ([place]) -CT head x2 6 hours apart with no change -Neurologic checks -Neurosurgery was consulted from the ER and no further follow-up recommended    Diabetes mellitus without complication ([place]) -Sliding scale insulin    Hypertension -Continue home meds   DVT prophylaxis: Lovenox Code Status: Full Family Communication: Relative Tawny Hopping [phone] on 05/03/2020 Disposition Plan: Status is: Inpatient   Remains inpatient appropriate because:Inpatient level of care appropriate due to severity of illness   Dispo: The patient is from: Home              Anticipated d/c is to: SNF              Anticipated d/c date is: 2 days              Patient currently is not medically stable to d/c.  Continues to receive IV antibiotics presumed infected sacral decubitus ulcer.  Anticipate 48 additional hours prior to disposition planning.  If patient and family are agreeable anticipate discharge to skilled nursing facility.       Consultants:   None  Procedures:   None  Antimicrobials:   Vancomycin  Ceftriaxone   Subjective: Patient seen and  examined.  Reports some symptomatic improvement over interval.  Continues to complain of pain in lower back.  Objective: Vitals:   05/04/20 0252 05/04/20 0328 05/04/20 0754 05/04/20 1139  BP:  (!) 150/77 (!) 145/73 (!) 162/64  Pulse:  82 83 67  Resp:  16 18 20   Temp:  97.9 F (36.6 C) 99 F (37.2 C) 98.1 F (36.7 C)  TempSrc:   Oral Oral Oral  SpO2:  98% 95% 97%  Weight: 59 kg     Height:        Intake/Output Summary (Last 24 hours) at 05/04/2020 1306 Last data filed at 05/04/2020 0900 Gross per 24 hour  Intake 360 ml  Output 575 ml  Net -215 ml   Filed Weights   05/02/20 1241 05/03/20 2017 05/04/20 0252  Weight: 68 kg 59 kg 59 kg    Examination:  General exam: No acute distress.  Appears chronically ill and frail Respiratory system: Clear to auscultation. Respiratory effort normal. Cardiovascular system: S1 & S2 heard, RRR. No JVD, murmurs, rubs, gallops or clicks. No pedal edema. Gastrointestinal system: Abdomen is nondistended, soft and nontender. No organomegaly or masses felt. Normal bowel sounds heard. Central nervous system: Alert, oriented x2.  No focal neurological deficits. Extremities: Symmetric 5 x 5 power. Skin: Periorbital facial ecchymosis.  Scattered excoriations Psychiatry: Judgement and insight appear impaired. Mood & affect flattened.     Data Reviewed: I have personally reviewed following labs and imaging studies  CBC: Recent Labs  Lab 05/02/20 1617 05/04/20 0811  WBC 12.4* 7.6  NEUTROABS 10.4* 6.1  HGB 13.2 13.7  HCT 39.3 39.7  MCV 93.1 91.7  PLT 201 683   Basic Metabolic Panel: Recent Labs  Lab 05/02/20 1617 05/04/20 0811  NA 137 138  K 3.7 3.9  CL 103 107  CO2 24 22  GLUCOSE 118* 135*  BUN 17 24*  CREATININE 1.06 0.91  CALCIUM 8.8* 9.0   GFR: Estimated Creatinine Clearance: 56.7 mL/min (by C-G formula based on SCr of 0.91 mg/dL). Liver Function Tests: No results for input(s): AST, ALT, ALKPHOS, BILITOT, PROT, ALBUMIN in the last 168 hours. No results for input(s): LIPASE, AMYLASE in the last 168 hours. No results for input(s): AMMONIA in the last 168 hours. Coagulation Profile: No results for input(s): INR, PROTIME in the last 168 hours. Cardiac Enzymes: No results for input(s): CKTOTAL, CKMB, CKMBINDEX, TROPONINI in the last 168 hours. BNP (last 3  results) No results for input(s): PROBNP in the last 8760 hours. HbA1C: Recent Labs    05/03/20 0507  HGBA1C 5.1   CBG: Recent Labs  Lab 05/03/20 1750 05/03/20 2100 05/04/20 0516 05/04/20 0748 05/04/20 1136  GLUCAP 145* 145* 121* 125* 222*   Lipid Profile: No results for input(s): CHOL, HDL, LDLCALC, TRIG, CHOLHDL, LDLDIRECT in the last 72 hours. Thyroid Function Tests: No results for input(s): TSH, T4TOTAL, FREET4, T3FREE, THYROIDAB in the last 72 hours. Anemia Panel: No results for input(s): VITAMINB12, FOLATE, FERRITIN, TIBC, IRON, RETICCTPCT in the last 72 hours. Sepsis Labs: No results for input(s): PROCALCITON, LATICACIDVEN in the last 168 hours.  Recent Results (from the past 240 hour(s))  Resp Panel by RT-PCR (Flu A&B, Covid) Nasopharyngeal Swab     Status: None   Collection Time: 05/03/20 12:45 AM   Specimen: Nasopharyngeal Swab; Nasopharyngeal(NP) swabs in vial transport medium  Result Value Ref Range Status   SARS Coronavirus 2 by RT PCR NEGATIVE NEGATIVE Final    Comment: (NOTE) SARS-CoV-2 target nucleic acids are  NOT DETECTED.  The SARS-CoV-2 RNA is generally detectable in upper respiratory specimens during the acute phase of infection. The lowest concentration of SARS-CoV-2 viral copies this assay can detect is 138 copies/mL. A negative result does not preclude SARS-Cov-2 infection and should not be used as the sole basis for treatment or other patient management decisions. A negative result may occur with  improper specimen collection/handling, submission of specimen other than nasopharyngeal swab, presence of viral mutation(s) within the areas targeted by this assay, and inadequate number of viral copies(<138 copies/mL). A negative result must be combined with clinical observations, patient history, and epidemiological information. The expected result is Negative.  Fact Sheet for Patients:  EntrepreneurPulse.com.au  Fact Sheet for  Healthcare Providers:  IncredibleEmployment.be  This test is no t yet approved or cleared by the [place] FDA and  has been authorized for detection and/or diagnosis of SARS-CoV-2 by FDA under an Emergency Use Authorization (EUA). This EUA will remain  in effect (meaning this test can be used) for the duration of the COVID-19 declaration under Section 564(b)(1) of the Act, 21 U.S.C.section 360bbb-3(b)(1), unless the authorization is terminated  or revoked sooner.       Influenza A by PCR NEGATIVE NEGATIVE Final   Influenza B by PCR NEGATIVE NEGATIVE Final    Comment: (NOTE) The Xpert Xpress SARS-CoV-2/FLU/RSV plus assay is intended as an aid in the diagnosis of influenza from Nasopharyngeal swab specimens and should not be used as a sole basis for treatment. Nasal washings and aspirates are unacceptable for Xpert Xpress SARS-CoV-2/FLU/RSV testing.  Fact Sheet for Patients: EntrepreneurPulse.com.au  Fact Sheet for Healthcare Providers: IncredibleEmployment.be  This test is not yet approved or cleared by the [place] FDA and has been authorized for detection and/or diagnosis of SARS-CoV-2 by FDA under an Emergency Use Authorization (EUA). This EUA will remain in effect (meaning this test can be used) for the duration of the COVID-19 declaration under Section 564(b)(1) of the Act, 21 U.S.C. section 360bbb-3(b)(1), unless the authorization is terminated or revoked.  Performed at [facility], [address] [place], [place] [address]          Radiology Studies: CT Head Wo Contrast  Result Date: 05/03/2020 CLINICAL DATA:  Subdural hematoma, follow-up examination EXAM: CT HEAD WITHOUT CONTRAST TECHNIQUE: Contiguous axial images were obtained from the base of the skull through the vertex without intravenous contrast. COMPARISON:  05/02/2020 FINDINGS: Brain: Tiny subdural hematoma seen just above the  right middle cranial fossa superficial to the frontal operculum is stable. No interval acute intracranial hemorrhage. Mild parenchymal volume loss is again noted, commensurate with the patient's age. Moderate periventricular white matter changes are present likely reflecting the sequela of small vessel ischemia. No abnormal mass effect or midline shift. No abnormal intra or extra-axial mass lesion. No evidence of acute infarct. Ventricular size is normal. Cerebellum is unremarkable. Vascular: No asymmetric hyperdense vasculature noted at the skull base. Skull: Intact. Sinuses/Orbits: Moderate mucosal thickening is noted throughout the ethmoid air cells bilaterally. Bilateral middle turbinectomy has been performed. No air-fluid levels are identified. Remaining paranasal sinuses are clear. Orbits are unremarkable. Other: Remote fracture of the right zygomatic bone noted. Mastoid air cells and middle ear cavities are clear. IMPRESSION: Stable tiny right subdural hematoma. No significant associated mass effect. No interval hemorrhage. Electronically Signed   By: Fidela Salisbury MD   On: 05/03/2020 00:40   CT HEAD WO CONTRAST  Result Date: 05/02/2020 CLINICAL DATA:  Head trauma, minor. Additional history  provided: Fall yesterday, bruise to upper eye/right forehead. EXAM: CT HEAD WITHOUT CONTRAST CT CERVICAL SPINE WITHOUT CONTRAST TECHNIQUE: Multidetector CT imaging of the head and cervical spine was performed following the standard protocol without intravenous contrast. Multiplanar CT image reconstructions of the cervical spine were also generated. COMPARISON:  Prior head CT examinations 10/28/2019 and earlier. FINDINGS: CT HEAD FINDINGS Brain: Mild cerebral atrophy. Acute subdural hematoma overlying the right frontal operculum and right temporal lobe measuring 5 mm in thickness (for instance as seen on series 2, image 19) (series 4, image 25). Minimal mass effect upon the underlying right frontal lobe. No midline  shift. Moderately advanced ill-defined hypoattenuation within the cerebral white matter is nonspecific, but compatible with chronic small vessel ischemic disease. No demarcated cortical infarct. No extra-axial fluid collection. No evidence of intracranial mass. Vascular: No hyperdense vessel.  Atherosclerotic calcifications. Skull: Normal. Negative for fracture or focal lesion. Sinuses/Orbits: Visualized orbits show no acute finding. Postsurgical appearance of the ethmoid sinuses with polypoid mucosal thickening. Trace mucosal thickening and small mucous retention cysts within the right maxillary sinus. Other: Displaced fracture of the right zygomatic arch. Mildly displaced fracture of the lateral wall of the right orbit (for instance as seen on series 3, image 24). These fractures are new as compared to the prior head CT of 10/28/2019. CT CERVICAL SPINE FINDINGS Alignment: Cervical levocurvature. Trace C3-C4 grade 1 anterolisthesis. Skull base and vertebrae: The basion-dental and atlanto-dental intervals are maintained.No evidence of acute fracture to the cervical spine. Soft tissues and spinal canal: No prevertebral fluid or swelling. No visible canal hematoma. Disc levels: Cervical spondylosis with multilevel disc space narrowing, disc bulges, posterior disc osteophytes, uncovertebral hypertrophy and facet arthrosis. Disc space narrowing is advanced at C4-C5, C5-C6 and C6-C7. A posterior disc osteophyte complex at C6-C7 contributes to at least mild spinal canal stenosis. Upper chest: Right apical pleuroparenchymal scarring. No consolidation within the imaged lung apices. No visible pneumothorax. These results were called by telephone at the time of interpretation on 05/02/2020 at 6:51 pm to provider Conni Slipper , who verbally acknowledged these results. IMPRESSION: CT head: 1. Acute subdural hematoma overlying the right frontal operculum and right temporal lobe measuring 5 mm in thickness. Minimal mass effect  upon the underlying right frontal lobe. No midline shift. 2. Displaced fractures of the right zygomatic arch and lateral wall of the right orbit, new from the prior head CT of 10/28/2019. Consider a dedicated maxillofacial CT for further evaluation. 3. Moderately advanced cerebral white matter chronic small vessel ischemic disease. 4. Mild cerebral atrophy. 5. Paranasal sinus disease as described. CT cervical spine: 1. No evidence of acute fracture to the cervical spine. 2. Mild C3-C4 grade 1 anterolisthesis. 3. Cervical levocurvature. 4. Cervical spondylosis as described and greatest at C4-C5, C5-C6 and C6-C7. Electronically Signed   By: Kellie Simmering DO   On: 05/02/2020 18:53   CT Cervical Spine Wo Contrast  Result Date: 05/02/2020 CLINICAL DATA:  Head trauma, minor. Additional history provided: Fall yesterday, bruise to upper eye/right forehead. EXAM: CT HEAD WITHOUT CONTRAST CT CERVICAL SPINE WITHOUT CONTRAST TECHNIQUE: Multidetector CT imaging of the head and cervical spine was performed following the standard protocol without intravenous contrast. Multiplanar CT image reconstructions of the cervical spine were also generated. COMPARISON:  Prior head CT examinations 10/28/2019 and earlier. FINDINGS: CT HEAD FINDINGS Brain: Mild cerebral atrophy. Acute subdural hematoma overlying the right frontal operculum and right temporal lobe measuring 5 mm in thickness (for instance as seen on series 2, image  19) (series 4, image 25). Minimal mass effect upon the underlying right frontal lobe. No midline shift. Moderately advanced ill-defined hypoattenuation within the cerebral white matter is nonspecific, but compatible with chronic small vessel ischemic disease. No demarcated cortical infarct. No extra-axial fluid collection. No evidence of intracranial mass. Vascular: No hyperdense vessel.  Atherosclerotic calcifications. Skull: Normal. Negative for fracture or focal lesion. Sinuses/Orbits: Visualized orbits show no  acute finding. Postsurgical appearance of the ethmoid sinuses with polypoid mucosal thickening. Trace mucosal thickening and small mucous retention cysts within the right maxillary sinus. Other: Displaced fracture of the right zygomatic arch. Mildly displaced fracture of the lateral wall of the right orbit (for instance as seen on series 3, image 24). These fractures are new as compared to the prior head CT of 10/28/2019. CT CERVICAL SPINE FINDINGS Alignment: Cervical levocurvature. Trace C3-C4 grade 1 anterolisthesis. Skull base and vertebrae: The basion-dental and atlanto-dental intervals are maintained.No evidence of acute fracture to the cervical spine. Soft tissues and spinal canal: No prevertebral fluid or swelling. No visible canal hematoma. Disc levels: Cervical spondylosis with multilevel disc space narrowing, disc bulges, posterior disc osteophytes, uncovertebral hypertrophy and facet arthrosis. Disc space narrowing is advanced at C4-C5, C5-C6 and C6-C7. A posterior disc osteophyte complex at C6-C7 contributes to at least mild spinal canal stenosis. Upper chest: Right apical pleuroparenchymal scarring. No consolidation within the imaged lung apices. No visible pneumothorax. These results were called by telephone at the time of interpretation on 05/02/2020 at 6:51 pm to provider Conni Slipper , who verbally acknowledged these results. IMPRESSION: CT head: 1. Acute subdural hematoma overlying the right frontal operculum and right temporal lobe measuring 5 mm in thickness. Minimal mass effect upon the underlying right frontal lobe. No midline shift. 2. Displaced fractures of the right zygomatic arch and lateral wall of the right orbit, new from the prior head CT of 10/28/2019. Consider a dedicated maxillofacial CT for further evaluation. 3. Moderately advanced cerebral white matter chronic small vessel ischemic disease. 4. Mild cerebral atrophy. 5. Paranasal sinus disease as described. CT cervical spine: 1. No  evidence of acute fracture to the cervical spine. 2. Mild C3-C4 grade 1 anterolisthesis. 3. Cervical levocurvature. 4. Cervical spondylosis as described and greatest at C4-C5, C5-C6 and C6-C7. Electronically Signed   By: Kellie Simmering DO   On: 05/02/2020 18:53   CT ABDOMEN PELVIS W CONTRAST  Result Date: 05/02/2020 CLINICAL DATA:  Abdominal abscess/infection suspected Pain in the low abdomen and reports of pain over the buttocks Fall yesterday.  Known decubitus ulcer. EXAM: CT ABDOMEN AND PELVIS WITH CONTRAST TECHNIQUE: Multidetector CT imaging of the abdomen and pelvis was performed using the standard protocol following bolus administration of intravenous contrast. CONTRAST:  123mL OMNIPAQUE IOHEXOL 300 MG/ML  SOLN COMPARISON:  CT 09/29/2017 FINDINGS: Lower chest: Chronic scarring in the lingula. The minimal subpleural reticulation which appears chronic. No acute airspace disease or pleural effusion. Hepatobiliary: Homogeneous liver attenuation without focal lesion. Innumerable stones fill the gallbladder. There is no pericholecystic edema. There is no biliary dilatation or visualized choledocholithiasis. Pancreas: No ductal dilatation or inflammation. There is a 13 x 9 mm cyst in the pancreatic body, increased in size from prior exam where it measured 9 x 7 mm. Spleen: Normal in size without focal abnormality. Adrenals/Urinary Tract: Normal adrenal glands. No hydronephrosis or perinephric edema. Homogeneous renal enhancement with symmetric excretion on delayed phase imaging. Small low-density lesions in the upper and lower right kidney are too small to characterize but likely small  cysts. There is an indeterminate 13 mm lesion in the posterior mid left kidney, unchanged in size in appearance from prior. Foley catheter in the urinary bladder, despite balloon in the bladder lumen the bladder is distended. There is no perivesicular fat stranding or wall thickening. Stomach/Bowel: Bowel evaluation is limited in  the absence of enteric contrast and paucity of intra-abdominal fat. Decompressed stomach. No small bowel obstruction or inflammatory change. The appendix is normal. Diffuse colonic diverticulosis without focal diverticulitis. Enteric sutures noted within the bowel loops in the central abdomen. The sigmoid colon is tortuous. Postoperative changes in the rectosigmoid colon. Collection of air and soft tissue density posterior left rectosigmoid colon with seen on prior exam, series 4, image 22. The air component is diminished, soft tissue component has increased measuring 3.7 cm. There is no definite peripherally enhancing collection. Presacral edema is again seen. Vascular/Lymphatic: Aortic atherosclerosis. No aortic aneurysm. Patent portal vein. No enlarged lymph nodes in the abdomen or pelvis. Reproductive: Prominent prostate gland. Other: Presacral soft tissue thickening also seen on prior exam. There is no ascites. No free air. Musculoskeletal: There is subcutaneous soft tissue thickening overlying the sacrum with associated skin thickening. Ill-defined areas of internal low density but no peripherally enhancing or drainable fluid collection. There is no definite subjacent bony destruction. Degenerative change in the lower lumbar spine. No acute fracture of the pelvis or spine. IMPRESSION: 1. Skin and soft tissue thickening overlying the sacrum consistent with decubitus ulcer. There is no drainable fluid collection, tracking air or CT findings of sacral osteomyelitis. 2. Postsurgical change in the rectosigmoid colon. There is a chronic perirectal collection of air that has been present dating back to 2019. The air component is diminished from prior, however there is increased surrounding soft tissue thickening. Etiology is indeterminate. This has been previously biopsied. Presacral soft tissue edema is stable. 3. Distended urinary bladder despite Foley catheter in place, recommend correlation with catheter  functioning. 4. 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 Recommend further characterization with pancreatic protocol on elective nonemergent basis. 5. Stable size and appearance of hyperattenuating 13 mm lesion in the left kidney. 6. Cholelithiasis without gallbladder inflammation. Aortic Atherosclerosis (ICD10-I70.0). Electronically Signed   By: Keith Rake M.D.   On: 05/02/2020 18:52   CT Maxillofacial Wo Contrast  Result Date: 05/02/2020 CLINICAL DATA:  Facial trauma EXAM: CT MAXILLOFACIAL WITHOUT CONTRAST TECHNIQUE: Multidetector CT imaging of the maxillofacial structures was performed. Multiplanar CT image reconstructions were also generated. COMPARISON:  Head CT today FINDINGS: Osseous: Fracture through the right zygomatic arch. Mandible is intact. No other facial fracture. Orbits: Fracture through the lateral wall of the right orbit. No other orbital fracture. Sinuses: Mucosal thickening within the maxillary sinuses. No air-fluid levels. Soft tissues: Negative Limited intracranial: See head CT report. IMPRESSION: Fractures through the right lateral orbital wall and right zygomatic arch. No additional facial or orbital fracture. Chronic sinusitis changes. Electronically Signed   By: Rolm Baptise M.D.   On: 05/02/2020 19:41        Scheduled Meds: . Chlorhexidine Gluconate Cloth  6 each Topical Daily  . insulin aspart  0-5 Units Subcutaneous QHS  . insulin aspart  0-9 Units Subcutaneous TID WC  . sodium chloride flush  3 mL Intravenous Q12H   Continuous Infusions: . cefTRIAXone (ROCEPHIN)  IV 1 g (05/04/20 0112)     LOS: 1 day    Time spent: 25 minutes    Sidney Ace, MD [facility] Pager [phone]  If 7PM-7AM, please contact night-coverage 05/04/2020, 1:06 PM

## 2020-05-05 LAB — GLUCOSE, CAPILLARY
Glucose-Capillary: 114 mg/dL — ABNORMAL HIGH (ref 70–99)
Glucose-Capillary: 113 mg/dL — ABNORMAL HIGH (ref 70–99)
Glucose-Capillary: 122 mg/dL — ABNORMAL HIGH (ref 70–99)
Glucose-Capillary: 141 mg/dL — ABNORMAL HIGH (ref 70–99)
Glucose-Capillary: 135 mg/dL — ABNORMAL HIGH (ref 70–99)

## 2020-05-05 MED ORDER — TAMSULOSIN HCL 0.4 MG PO CAPS
0.4000 mg | ORAL_CAPSULE | Freq: Every day | ORAL | Status: DC
  Administered 2020-05-05 – 2020-05-07 (×3): 0.4 mg via ORAL
  Filled 2020-05-05 (×3): qty 1

## 2020-05-05 NOTE — Plan of Care (Signed)
Continuing with plan of care. 

## 2020-05-05 NOTE — Progress Notes (Signed)
Physical Therapy Treatment Patient Details Name: John Shaw MRN: [id number] DOB: 05-27-1940 Today's Date: 05/05/2020    History of Present Illness 79 y.o. male with medical history significant for DM, HTN, and rectal cancer with history of sacral decubitus who presents to the emergency room with complaint of pain in his left buttock for the past several days.  Also fell at home the day prior and hit his head but did not lose consciousness.    PT Comments    Pt was supine in bed with Hob slightly elevated. He is alert however disoriented. He states he thought it was time to watch wrestling at 8 pm. Requested channel to be changed to 8. He did agree to PT session and OOB activity. Pt required mod assist to exit bed. Min assist to stand from elevated bed height to RW. Then mod assist to ambulate to doorway and return. Pt has very unsteady shuffling gait pattern. Required extensive assistance to lateral wt shift to allow opposite LE advancement. Does endorse fatigue on returning from doorway. Overall pt tolerated session well. Highly recommend DC to SNF to address deficits while improving pt's independence.    Follow Up Recommendations  SNF     Equipment Recommendations  Other (comment) (defer to next level of care)    Recommendations for Other Services       Precautions / Restrictions Precautions Precautions: Fall Restrictions Weight Bearing Restrictions: No    Mobility  Bed Mobility Overal bed mobility: Needs Assistance Bed Mobility: Supine to Sit;Sit to Supine     Supine to sit: Mod assist Sit to supine: Mod assist   General bed mobility comments: Increased time to perform with vcs for safety and technique improvements  Transfers Overall transfer level: Needs assistance Equipment used: Rolling walker (2 wheeled) Transfers: Sit to/from Stand Sit to Stand: Min assist         General transfer comment: Min assist to stand from elevated bed height. stood 3 x EOB  to RW  Ambulation/Gait Ambulation/Gait assistance: Min assist;Mod assist Gait Distance (Feet): 25 Feet Assistive device: Rolling walker (2 wheeled) Gait Pattern/deviations: Shuffle;Festinating;Staggering left;Staggering right;Trunk flexed;Narrow base of support Gait velocity: decrease   General Gait Details: Pt was able to ambulate from EOB to doorway with unsteady gait kinematics.       Balance Overall balance assessment: Needs assistance Sitting-balance support: Bilateral upper extremity supported Sitting balance-Leahy Scale: Good Sitting balance - Comments: Pt has no LOB in sitting   Standing balance support: Bilateral upper extremity supported;During functional activity Standing balance-Leahy Scale: Poor Standing balance comment: poor dynamic/gait balance. mod assist       Cognition Arousal/Alertness: Awake/alert Behavior During Therapy: Flat affect Overall Cognitive Status: Within Functional Limits for tasks assessed        General Comments: Pt was alert, watching TV, but not oriented to situation             Pertinent Vitals/Pain Pain Assessment: No/denies pain           PT Goals (current goals can now be found in the care plan section) Acute Rehab PT Goals Patient Stated Goal: Pt wants to go home Progress towards PT goals: Progressing toward goals    Frequency    Min 2X/week      PT Plan Current plan remains appropriate       AM-PAC PT "6 Clicks" Mobility   Outcome Measure  Help needed turning from your back to your side while in a flat bed without using bedrails?:  A Little Help needed moving from lying on your back to sitting on the side of a flat bed without using bedrails?: A Lot Help needed moving to and from a bed to a chair (including a wheelchair)?: A Lot Help needed standing up from a chair using your arms (e.g., wheelchair or bedside chair)?: A Lot Help needed to walk in hospital room?: Total Help needed climbing 3-5 steps with a  railing? : Total 6 Click Score: 11    End of Session Equipment Utilized During Treatment: Gait belt Activity Tolerance: Patient tolerated treatment well Patient left: in bed;with call bell/phone within reach;with bed alarm set Nurse Communication: Mobility status PT Visit Diagnosis: History of falling (Z91.81);Muscle weakness (generalized) (M62.81);Difficulty in walking, not elsewhere classified (R26.2)     Time: 1624-4695 PT Time Calculation (min) (ACUTE ONLY): 11 min  Charges:  $Therapeutic Activity: 8-22 mins                     Julaine Fusi PTA 05/05/20, 3:58 PM

## 2020-05-05 NOTE — Care Management Important Message (Signed)
Important Message  Patient Details  Name: John Shaw MRN: [id number] Date of Birth: 1941/05/10   Medicare Important Message Given:  Yes     Dannette Barbara 05/05/2020, 12:07 PM

## 2020-05-05 NOTE — Progress Notes (Addendum)
PROGRESS NOTE    John Shaw  AJO:[id number] DOB: [birth year] DOA: 05/02/2020 PCP: John Billet, MD   Brief Narrative:  79 year old male with history significant for diabetes, hypertension, rectal cancer who presented to the emergency room with chief complaint of left buttock pain for past several days.  Also apparently had a unwitnessed fall at home.  CT imaging demonstrated fracture zygomatic arch with evidence of a stable subdural hematoma.  Neurosurgery and ENT were consulted from the emergency room.  Neither 1 recommended inpatient intervention and rather suggested outpatient follow-up post discharge.  Regarding the buttock cellulitis CT imaging did not demonstrate any evidence of fluid collection or abscess.  Patient was started on broad-spectrum IV antibiotics.  His pain is starting to improve.  His level of orientation is also starting to improve.  Had a lengthy conversation with his cousin John Shaw [phone].  The patient lives with this cousin.  She states that his level of mobility has been decreasing.  She encourages him to ambulate at home which she has been reluctant to do so.  She was on able to tell me exactly what the circumstances surrounding his fall were.  12/16: Mentation and pain control improving.  Patient intermittently tearful 12/17: Patient remains hemodynamically stable.  Mentation appears improved.  Less tearful today    Assessment & Plan:   Principal Problem:   Cellulitis of buttock Active Problems:   Diabetes mellitus without complication ([place])   Hypertension   Sacral decubitus ulcer, stage III (HCC)   Acute urinary retention   Fall at home, initial encounter   Facial bone fracture (HCC)   Subdural hematoma without coma (HCC)    Possible cellulitis of buttock Painful sacral decubitus ulcer, stage III (HCC) -CT abdomen and pelvis demonstrating skin and soft tissue thickening overlying the sacrum consistent with decubitus ulcer.  There is no drainable fluid collection, tracking air or CT findings of sacral osteomyelitis Plan: Continue IV Rocephin Can transition to oral Augmentin at time of discharge Continue wound care per [facility]OC[facility] recommendations Decubitus precautions    Acute urinary retention Patient was bladder scanned in ED with 500 cc.  Foley catheter was placed presumably without attempted in and out catheterization. -Patient voiding spontaneously    Fall at home, initial encounter Multiple facial bone fracture [name]) -Outpatient ENT referral -Pain management    Subdural hematoma without coma ([place]) -CT head x2 6 hours apart with no change -Neurologic checks -Neurosurgery was consulted from the ER and no further follow-up recommended    Diabetes mellitus without complication ([place]) -Sliding scale insulin    Hypertension -Continue home meds   DVT prophylaxis: Lovenox Code Status: Full Family Communication: Relative John Shaw [phone] on 05/03/2020.  Left VM on 05/05/2020 Disposition Plan: Status is: Inpatient  Remains inpatient appropriate because:Inpatient level of care appropriate due to severity of illness   Dispo: The patient is from: Home              Anticipated d/c is to: SNF              Anticipated d/c date is: 1 day              Patient currently is not medically stable to d/c.   We will continue IV antibiotics for additional 24 hours.  If patient remained stable and fever free he will be medically stable for discharge on 05/06/2020.  Current disposition plan is for skilled nursing facility if patient and family are agreeable  Consultants:  None  Procedures:   None  Antimicrobials:   Vancomycin  Ceftriaxone   Subjective: Patient seen and examined.  Reports improvement in pain over interval.  No other complaints.  Objective: Vitals:   05/04/20 1652 05/04/20 2138 05/05/20 0427 05/05/20 0430  BP: (!) 168/60 (!) 154/76  130/63  Pulse: (!) 40 86  69  Resp:  16   18  Temp: 98.5 F (36.9 C) 99 F (37.2 C)  98.7 F (37.1 C)  TempSrc: Oral Oral    SpO2: 96% 94%  94%  Weight:   59.3 kg   Height:        Intake/Output Summary (Last 24 hours) at 05/05/2020 1350 Last data filed at 05/05/2020 1019 Gross per 24 hour  Intake 720 ml  Output 700 ml  Net 20 ml   Filed Weights   05/03/20 2017 05/04/20 0252 05/05/20 0427  Weight: 59 kg 59 kg 59.3 kg    Examination:  General exam: No acute distress.  Appears chronically ill and frail Respiratory system: Clear to auscultation. Respiratory effort normal. Cardiovascular system: S1 & S2 heard, RRR. No JVD, murmurs, rubs, gallops or clicks. No pedal edema. Gastrointestinal system: Abdomen is nondistended, soft and nontender. No organomegaly or masses felt. Normal bowel sounds heard. Central nervous system: Alert, oriented x2.  No focal neurological deficits. Extremities: Symmetric 5 x 5 power. Skin: Periorbital facial ecchymosis.  Scattered excoriations Psychiatry: Judgement and insight appear impaired. Mood & affect flattened.     Data Reviewed: I have personally reviewed following labs and imaging studies  CBC: Recent Labs  Lab 05/02/20 1617 05/04/20 0811  WBC 12.4* 7.6  NEUTROABS 10.4* 6.1  HGB 13.2 13.7  HCT 39.3 39.7  MCV 93.1 91.7  PLT 201 616   Basic Metabolic Panel: Recent Labs  Lab 05/02/20 1617 05/04/20 0811  NA 137 138  K 3.7 3.9  CL 103 107  CO2 24 22  GLUCOSE 118* 135*  BUN 17 24*  CREATININE 1.06 0.91  CALCIUM 8.8* 9.0   GFR: Estimated Creatinine Clearance: 57 mL/min (by C-G formula based on SCr of 0.91 mg/dL). Liver Function Tests: No results for input(s): AST, ALT, ALKPHOS, BILITOT, PROT, ALBUMIN in the last 168 hours. No results for input(s): LIPASE, AMYLASE in the last 168 hours. No results for input(s): AMMONIA in the last 168 hours. Coagulation Profile: No results for input(s): INR, PROTIME in the last 168 hours. Cardiac Enzymes: No results for  input(s): CKTOTAL, CKMB, CKMBINDEX, TROPONINI in the last 168 hours. BNP (last 3 results) No results for input(s): PROBNP in the last 8760 hours. HbA1C: Recent Labs    05/03/20 0507  HGBA1C 5.1   CBG: Recent Labs  Lab 05/04/20 1136 05/04/20 1650 05/04/20 2142 05/05/20 0612 05/05/20 0853  GLUCAP 222* 121* 125* 114* 113*   Lipid Profile: No results for input(s): CHOL, HDL, LDLCALC, TRIG, CHOLHDL, LDLDIRECT in the last 72 hours. Thyroid Function Tests: No results for input(s): TSH, T4TOTAL, FREET4, T3FREE, THYROIDAB in the last 72 hours. Anemia Panel: No results for input(s): VITAMINB12, FOLATE, FERRITIN, TIBC, IRON, RETICCTPCT in the last 72 hours. Sepsis Labs: No results for input(s): PROCALCITON, LATICACIDVEN in the last 168 hours.  Recent Results (from the past 240 hour(s))  Resp Panel by RT-PCR (Flu A&B, Covid) Nasopharyngeal Swab     Status: None   Collection Time: 05/03/20 12:45 AM   Specimen: Nasopharyngeal Swab; Nasopharyngeal(NP) swabs in vial transport medium  Result Value Ref Range Status   SARS Coronavirus 2 by RT  PCR NEGATIVE NEGATIVE Final    Comment: (NOTE) SARS-CoV-2 target nucleic acids are NOT DETECTED.  The SARS-CoV-2 RNA is generally detectable in upper respiratory specimens during the acute phase of infection. The lowest concentration of SARS-CoV-2 viral copies this assay can detect is 138 copies/mL. A negative result does not preclude SARS-Cov-2 infection and should not be used as the sole basis for treatment or other patient management decisions. A negative result may occur with  improper specimen collection/handling, submission of specimen other than nasopharyngeal swab, presence of viral mutation(s) within the areas targeted by this assay, and inadequate number of viral copies(<138 copies/mL). A negative result must be combined with clinical observations, patient history, and epidemiological information. The expected result is Negative.  Fact  Sheet for Patients:  EntrepreneurPulse.com.au  Fact Sheet for Healthcare Providers:  IncredibleEmployment.be  This test is no t yet approved or cleared by the [place] FDA and  has been authorized for detection and/or diagnosis of SARS-CoV-2 by FDA under an Emergency Use Authorization (EUA). This EUA will remain  in effect (meaning this test can be used) for the duration of the COVID-19 declaration under Section 564(b)(1) of the Act, 21 U.S.C.section 360bbb-3(b)(1), unless the authorization is terminated  or revoked sooner.       Influenza A by PCR NEGATIVE NEGATIVE Final   Influenza B by PCR NEGATIVE NEGATIVE Final    Comment: (NOTE) The Xpert Xpress SARS-CoV-2/FLU/RSV plus assay is intended as an aid in the diagnosis of influenza from Nasopharyngeal swab specimens and should not be used as a sole basis for treatment. Nasal washings and aspirates are unacceptable for Xpert Xpress SARS-CoV-2/FLU/RSV testing.  Fact Sheet for Patients: EntrepreneurPulse.com.au  Fact Sheet for Healthcare Providers: IncredibleEmployment.be  This test is not yet approved or cleared by the [place] FDA and has been authorized for detection and/or diagnosis of SARS-CoV-2 by FDA under an Emergency Use Authorization (EUA). This EUA will remain in effect (meaning this test can be used) for the duration of the COVID-19 declaration under Section 564(b)(1) of the Act, 21 U.S.C. section 360bbb-3(b)(1), unless the authorization is terminated or revoked.  Performed at [facility], [address] [place], [place] [address]   MRSA PCR Screening     Status: None   Collection Time: 05/04/20  6:53 PM   Specimen: Nasopharyngeal  Result Value Ref Range Status   MRSA by PCR NEGATIVE NEGATIVE Final    Comment:        The GeneXpert MRSA Assay (FDA approved for NASAL specimens only), is one component of a comprehensive  MRSA colonization surveillance program. It is not intended to diagnose MRSA infection nor to guide or monitor treatment for MRSA infections. Performed at [facility], [address] [place], [place] [address]          Radiology Studies: No results found.      Scheduled Meds: . Chlorhexidine Gluconate Cloth  6 each Topical Daily  . enoxaparin (LOVENOX) injection  40 mg Subcutaneous Q24H  . insulin aspart  0-5 Units Subcutaneous QHS  . insulin aspart  0-9 Units Subcutaneous TID WC  . melatonin  5 mg Oral QHS  . sodium chloride flush  3 mL Intravenous Q12H  . tamsulosin  0.4 mg Oral QPC supper   Continuous Infusions: . cefTRIAXone (ROCEPHIN)  IV 1 g (05/05/20 0203)     LOS: 2 days    Time spent: 15 minutes    Sidney Ace, MD [facility] Pager [phone]  If 7PM-7AM, please contact night-coverage 05/05/2020, 1:50 PM

## 2020-05-05 NOTE — Progress Notes (Addendum)
Bladder scanned patient whose reading is 378mL, attempted to I and O patient twice, who tolerated well, but meeting resistance.  Dr. Priscella Mann notified and instructed not to do anything further for now and the patient has been started on flomax.

## 2020-05-06 LAB — GLUCOSE, CAPILLARY
Glucose-Capillary: 174 mg/dL — ABNORMAL HIGH (ref 70–99)
Glucose-Capillary: 102 mg/dL — ABNORMAL HIGH (ref 70–99)
Glucose-Capillary: 119 mg/dL — ABNORMAL HIGH (ref 70–99)

## 2020-05-06 MED ORDER — AMOXICILLIN-POT CLAVULANATE 875-125 MG PO TABS
1.0000 | ORAL_TABLET | Freq: Two times a day (BID) | ORAL | Status: DC
  Administered 2020-05-06 – 2020-05-08 (×5): 1 via ORAL
  Filled 2020-05-06 (×5): qty 1

## 2020-05-06 NOTE — TOC Progression Note (Signed)
Transition of Care [facility]) - Progression Note    Patient Details  Name: John Shaw MRN: [id number] Date of Birth: 1940/10/04  Transition of [facility]) CM/SW [place], LCSW Phone Number: 05/06/2020, 3:06 PM  Clinical Narrative:    CSW attempted to speak with Levada Dy about bed offer from [facility], no answer, CSW left a message. CSW submitted [facility] auth request via [employer] portal. Waiting for approval        Expected Discharge Plan and Services                                                 Social Determinants of Health (SDOH) Interventions    Readmission Risk Interventions No flowsheet data found.

## 2020-05-06 NOTE — Progress Notes (Signed)
PROGRESS NOTE    John Shaw  XAJ:[id number] DOB: 1941/03/28 DOA: 05/02/2020 PCP: Albina Billet, MD   Brief Narrative:  79 year old male with history significant for diabetes, hypertension, rectal cancer who presented to the emergency room with chief complaint of left buttock pain for past several days.  Also apparently had a unwitnessed fall at home.  CT imaging demonstrated fracture zygomatic arch with evidence of a stable subdural hematoma.  Neurosurgery and ENT were consulted from the emergency room.  Neither 1 recommended inpatient intervention and rather suggested outpatient follow-up post discharge.  Regarding the buttock cellulitis CT imaging did not demonstrate any evidence of fluid collection or abscess.  Patient was started on broad-spectrum IV antibiotics.  His pain is starting to improve.  His level of orientation is also starting to improve.  Had a lengthy conversation with his cousin Tawny Hopping [phone].  The patient lives with this cousin.  She states that his level of mobility has been decreasing.  She encourages him to ambulate at home which she has been reluctant to do so.  She was on able to tell me exactly what the circumstances surrounding his fall were.  12/16: Mentation and pain control improving.  Patient intermittently tearful 12/17: Patient remains hemodynamically stable.  Mentation appears improved.  Less tearful today 12/18: No acute overnight events.  Patient frequently calls out to nursing    Assessment & Plan:   Principal Problem:   Cellulitis of buttock Active Problems:   Diabetes mellitus without complication ([place])   Hypertension   Sacral decubitus ulcer, stage III ([place])   Acute urinary retention   Fall at home, initial encounter   Facial bone fracture (HCC)   Subdural hematoma without coma ([place])   Possible cellulitis of buttock Painful sacral decubitus ulcer, stage III (HCC) -CT abdomen and pelvis demonstrating skin and soft  tissue thickening overlying the sacrum consistent with decubitus ulcer. There is no drainable fluid collection, tracking air or CT findings of sacral osteomyelitis Plan: DC IV Rocephin Transition to oral Augmentin Plan for 7-day course of antibiotics Continue wound care per WOCN recommendations Decubitus precautions  Acute urinary retention, resolved Patient was bladder scanned in ED with 500 cc.  Foley catheter was placed presumably without attempted in and out catheterization. -Foley discontinued -Patient voiding spontaneously    Fall at home, initial encounter Multiple facial bone fracture [facility]) -Outpatient ENT referral -Pain management    Subdural hematoma without coma ([place]) -CT head x2 6 hours apart with no change -Neurologic checks -Neurosurgery was consulted from the ER and no further follow-up recommended -Consider outpatient NSG referral    Diabetes mellitus without complication ([place]) -Sliding scale insulin    Hypertension -Continue home meds   DVT prophylaxis: Lovenox Code Status: Full Family Communication: Relative Tawny Hopping [phone] on 05/06/2020.   Disposition Plan: Status is: Inpatient  Remains inpatient appropriate because:Unsafe d/c plan   Dispo: The patient is from: Home              Anticipated d/c is to: SNF              Anticipated d/c date is: 1 day              Patient currently is medically stable to d/c.   Patient is off antibiotics and medically stable for discharge at this time.  I communicated with his relative Tyrone Schimke.  Patient and relatives are in agreement with SNF at time of disposition.  Will notify case management of  medical readiness for discharge.        Consultants:   None  Procedures:   None  Antimicrobials:   Vancomycin  Ceftriaxone   Subjective: No pain complaints.  Appears a bit anxious this morning  Objective: Vitals:   05/05/20 1609 05/05/20 2125 05/06/20 0601 05/06/20 1300  BP:  (!) 153/64 (!) 150/89 (!) 143/80 (!) 153/60  Pulse: 77 73 74 77  Resp: 16 16 20 18   Temp: 97.8 F (36.6 C) 97.8 F (36.6 C) 98.7 F (37.1 C) 98.9 F (37.2 C)  TempSrc: Oral Oral Oral Oral  SpO2: 99% 99% 99% 99%  Weight:      Height:        Intake/Output Summary (Last 24 hours) at 05/06/2020 1315 Last data filed at 05/06/2020 0900 Gross per 24 hour  Intake 1140 ml  Output 400 ml  Net 740 ml   Filed Weights   05/03/20 2017 05/04/20 0252 05/05/20 0427  Weight: 59 kg 59 kg 59.3 kg    Examination:  General exam: No acute distress.  Appears chronically ill and frail Respiratory system: Clear to auscultation. Respiratory effort normal. Cardiovascular system: S1 & S2 heard, RRR. No JVD, murmurs, rubs, gallops or clicks. No pedal edema. Gastrointestinal system: Abdomen is nondistended, soft and nontender. No organomegaly or masses felt. Normal bowel sounds heard. Central nervous system: Alert, oriented x2.  No focal neurological deficits. Extremities: Symmetric 5 x 5 power. Skin: Periorbital facial ecchymosis.  Scattered excoriations Psychiatry: Judgement and insight appear impaired. Mood & affect flattened.     Data Reviewed: I have personally reviewed following labs and imaging studies  CBC: Recent Labs  Lab 05/02/20 1617 05/04/20 0811  WBC 12.4* 7.6  NEUTROABS 10.4* 6.1  HGB 13.2 13.7  HCT 39.3 39.7  MCV 93.1 91.7  PLT 201 400   Basic Metabolic Panel: Recent Labs  Lab 05/02/20 1617 05/04/20 0811  NA 137 138  K 3.7 3.9  CL 103 107  CO2 24 22  GLUCOSE 118* 135*  BUN 17 24*  CREATININE 1.06 0.91  CALCIUM 8.8* 9.0   GFR: Estimated Creatinine Clearance: 57 mL/min (by C-G formula based on SCr of 0.91 mg/dL). Liver Function Tests: No results for input(s): AST, ALT, ALKPHOS, BILITOT, PROT, ALBUMIN in the last 168 hours. No results for input(s): LIPASE, AMYLASE in the last 168 hours. No results for input(s): AMMONIA in the last 168 hours. Coagulation  Profile: No results for input(s): INR, PROTIME in the last 168 hours. Cardiac Enzymes: No results for input(s): CKTOTAL, CKMB, CKMBINDEX, TROPONINI in the last 168 hours. BNP (last 3 results) No results for input(s): PROBNP in the last 8760 hours. HbA1C: No results for input(s): HGBA1C in the last 72 hours. CBG: Recent Labs  Lab 05/05/20 1406 05/05/20 1827 05/05/20 2111 05/06/20 0914 05/06/20 1301  GLUCAP 122* 141* 135* 174* 102*   Lipid Profile: No results for input(s): CHOL, HDL, LDLCALC, TRIG, CHOLHDL, LDLDIRECT in the last 72 hours. Thyroid Function Tests: No results for input(s): TSH, T4TOTAL, FREET4, T3FREE, THYROIDAB in the last 72 hours. Anemia Panel: No results for input(s): VITAMINB12, FOLATE, FERRITIN, TIBC, IRON, RETICCTPCT in the last 72 hours. Sepsis Labs: No results for input(s): PROCALCITON, LATICACIDVEN in the last 168 hours.  Recent Results (from the past 240 hour(s))  Resp Panel by RT-PCR (Flu A&B, Covid) Nasopharyngeal Swab     Status: None   Collection Time: 05/03/20 12:45 AM   Specimen: Nasopharyngeal Swab; Nasopharyngeal(NP) swabs in vial transport medium  Result Value  Ref Range Status   SARS Coronavirus 2 by RT PCR NEGATIVE NEGATIVE Final    Comment: (NOTE) SARS-CoV-2 target nucleic acids are NOT DETECTED.  The SARS-CoV-2 RNA is generally detectable in upper respiratory specimens during the acute phase of infection. The lowest concentration of SARS-CoV-2 viral copies this assay can detect is 138 copies/mL. A negative result does not preclude SARS-Cov-2 infection and should not be used as the sole basis for treatment or other patient management decisions. A negative result may occur with  improper specimen collection/handling, submission of specimen other than nasopharyngeal swab, presence of viral mutation(s) within the areas targeted by this assay, and inadequate number of viral copies(<138 copies/mL). A negative result must be combined  with clinical observations, patient history, and epidemiological information. The expected result is Negative.  Fact Sheet for Patients:  EntrepreneurPulse.com.au  Fact Sheet for Healthcare Providers:  IncredibleEmployment.be  This test is no t yet approved or cleared by the [place] FDA and  has been authorized for detection and/or diagnosis of SARS-CoV-2 by FDA under an Emergency Use Authorization (EUA). This EUA will remain  in effect (meaning this test can be used) for the duration of the COVID-19 declaration under Section 564(b)(1) of the Act, 21 U.S.C.section 360bbb-3(b)(1), unless the authorization is terminated  or revoked sooner.       Influenza A by PCR NEGATIVE NEGATIVE Final   Influenza B by PCR NEGATIVE NEGATIVE Final    Comment: (NOTE) The Xpert Xpress SARS-CoV-2/FLU/RSV plus assay is intended as an aid in the diagnosis of influenza from Nasopharyngeal swab specimens and should not be used as a sole basis for treatment. Nasal washings and aspirates are unacceptable for Xpert Xpress SARS-CoV-2/FLU/RSV testing.  Fact Sheet for Patients: EntrepreneurPulse.com.au  Fact Sheet for Healthcare Providers: IncredibleEmployment.be  This test is not yet approved or cleared by the [place] FDA and has been authorized for detection and/or diagnosis of SARS-CoV-2 by FDA under an Emergency Use Authorization (EUA). This EUA will remain in effect (meaning this test can be used) for the duration of the COVID-19 declaration under Section 564(b)(1) of the Act, 21 U.S.C. section 360bbb-3(b)(1), unless the authorization is terminated or revoked.  Performed at [facility], [address] [place], [place] [address]   MRSA PCR Screening     Status: None   Collection Time: 05/04/20  6:53 PM   Specimen: Nasopharyngeal  Result Value Ref Range Status   MRSA by PCR NEGATIVE NEGATIVE Final     Comment:        The GeneXpert MRSA Assay (FDA approved for NASAL specimens only), is one component of a comprehensive MRSA colonization surveillance program. It is not intended to diagnose MRSA infection nor to guide or monitor treatment for MRSA infections. Performed at [facility], [address], [place],  [address]          Radiology Studies: No results found.      Scheduled Meds: . Chlorhexidine Gluconate Cloth  6 each Topical Daily  . enoxaparin (LOVENOX) injection  40 mg Subcutaneous Q24H  . insulin aspart  0-5 Units Subcutaneous QHS  . insulin aspart  0-9 Units Subcutaneous TID WC  . melatonin  5 mg Oral QHS  . sodium chloride flush  3 mL Intravenous Q12H  . tamsulosin  0.4 mg Oral QPC supper   Continuous Infusions: . cefTRIAXone (ROCEPHIN)  IV 200 mL/hr at 05/06/20 0426     LOS: 3 days    Time spent: 15 minutes  Sidney Ace, MD [facility] Pager [phone]  If 7PM-7AM, please contact night-coverage 05/06/2020, 1:15 PM

## 2020-05-06 NOTE — Plan of Care (Signed)
Continuing with plan of care. 

## 2020-05-07 LAB — BASIC METABOLIC PANEL
Anion gap: 9 (ref 5–15)
BUN: 30 mg/dL — ABNORMAL HIGH (ref 8–23)
CO2: 24 mmol/L (ref 22–32)
Calcium: 8.9 mg/dL (ref 8.9–10.3)
Chloride: 101 mmol/L (ref 98–111)
Creatinine, Ser: 0.98 mg/dL (ref 0.61–1.24)
GFR, Estimated: >60 mL/min (ref >60)
Glucose, Bld: 151 mg/dL — ABNORMAL HIGH (ref 70–99)
Potassium: 4.4 mmol/L (ref 3.5–5.1)
Sodium: 134 mmol/L — ABNORMAL LOW (ref 135–145)

## 2020-05-07 LAB — CBC WITH DIFFERENTIAL/PLATELET
Abs Immature Granulocytes: 0.05 10*3/uL (ref 0.00–0.07)
Basophils Absolute: 0 10*3/uL (ref 0.0–0.1)
Basophils Relative: 0 %
Eosinophils Absolute: 0.2 10*3/uL (ref 0.0–0.5)
Eosinophils Relative: 2 %
HCT: 36.6 % — ABNORMAL LOW (ref 39.0–52.0)
Hemoglobin: 12.7 g/dL — ABNORMAL LOW (ref 13.0–17.0)
Immature Granulocytes: 1 %
Lymphocytes Relative: 17 %
Lymphs Abs: 1.3 10*3/uL (ref 0.7–4.0)
MCH: 31.8 pg (ref 26.0–34.0)
MCHC: 34.7 g/dL (ref 30.0–36.0)
MCV: 91.5 fL (ref 80.0–100.0)
Monocytes Absolute: 0.8 10*3/uL (ref 0.1–1.0)
Monocytes Relative: 11 %
Neutro Abs: 5 10*3/uL (ref 1.7–7.7)
Neutrophils Relative %: 69 %
Platelets: 237 10*3/uL (ref 150–400)
RBC: 4 MIL/uL — ABNORMAL LOW (ref 4.22–5.81)
RDW: 12.5 % (ref 11.5–15.5)
WBC: 7.4 10*3/uL (ref 4.0–10.5)
nRBC: 0 % (ref 0.0–0.2)

## 2020-05-07 LAB — GLUCOSE, CAPILLARY
Glucose-Capillary: 131 mg/dL — ABNORMAL HIGH (ref 70–99)
Glucose-Capillary: 94 mg/dL (ref 70–99)
Glucose-Capillary: 98 mg/dL (ref 70–99)

## 2020-05-07 NOTE — Progress Notes (Signed)
PROGRESS NOTE    John Shaw  WGN:[id number] DOB: 1940-12-25 DOA: 05/02/2020 PCP: Albina Billet, MD   Brief Narrative:  79 year old male with history significant for diabetes, hypertension, rectal cancer who presented to the emergency room with chief complaint of left buttock pain for past several days.  Also apparently had a unwitnessed fall at home.  CT imaging demonstrated fracture zygomatic arch with evidence of a stable subdural hematoma.  Neurosurgery and ENT were consulted from the emergency room.  Neither 1 recommended inpatient intervention and rather suggested outpatient follow-up post discharge.  Regarding the buttock cellulitis CT imaging did not demonstrate any evidence of fluid collection or abscess.  Patient was started on broad-spectrum IV antibiotics.  His pain is starting to improve.  His level of orientation is also starting to improve.  Had a lengthy conversation with his cousin Tawny Hopping [phone].  The patient lives with this cousin.  She states that his level of mobility has been decreasing.  She encourages him to ambulate at home which she has been reluctant to do so.  She was on able to tell me exactly what the circumstances surrounding his fall were.  12/16: Mentation and pain control improving.  Patient intermittently tearful 12/17: Patient remains hemodynamically stable.  Mentation appears improved.  Less tearful today 12/18: No acute overnight events.  Patient frequently calls out to nursing    Assessment & Plan:   Principal Problem:   Cellulitis of buttock Active Problems:   Diabetes mellitus without complication ([place])   Hypertension   Sacral decubitus ulcer, stage III ([place])   Acute urinary retention   Fall at home, initial encounter   Facial bone fracture (HCC)   Subdural hematoma without coma ([place])   Possible cellulitis of buttock Painful sacral decubitus ulcer, stage III (HCC) -CT abdomen and pelvis demonstrating skin and soft  tissue thickening overlying the sacrum consistent with decubitus ulcer. There is no drainable fluid collection, tracking air or CT findings of sacral osteomyelitis --ceftriaxone transitioned to Augmentin Plan: --cont Augmentin for a 7-day course Continue wound care per WOCN recommendations Decubitus precautions  Acute urinary retention, resolved Patient was bladder scanned in ED with 500 cc.  Foley catheter was placed presumably without attempted in and out catheterization. -Foley discontinued -Patient voiding spontaneously    Fall at home, initial encounter Multiple facial bone fracture [facility]) -Outpatient ENT referral --Pain control with PRN Norco    Subdural hematoma without coma ([place]) -CT head x2 6 hours apart with no change -Neurologic checks -Neurosurgery was consulted from the ER and no further follow-up recommended -Consider outpatient NSG referral  Hx of Diabetes mellitus without complication ([place]), not currently active -A1c 5.1 --d/c fingersticks  Hx of Hypertension -not on home BP meds --will not start BP meds during inpatient setting   DVT prophylaxis: Lovenox Code Status: Full Family Communication:  Disposition Plan: Status is: Inpatient   Dispo: The patient is from: Home              Anticipated d/c is to: SNF              Anticipated d/c date is: whenever TOC is able to reach family to notify of discharge              Patient currently is medically stable to d/c.   Pt has a bed ready for him at SNF today, however, TOC reported not being able to reach family since yesterday to notify family of the SNF choice and permission  for discharge.   Consultants:   None  Procedures:   None  Antimicrobials:   Vancomycin  Ceftriaxone   Subjective: Pt complained of being uncomfortable in bed and repeatedly called for for his nurse "Claiborne Billings" to help him.  Confused.  Said he was too weak to move.  Pt has a bed ready for him at SNF today, however, TOC  reported not being able to reach family since yesterday to notify family of the SNF choice and permission for discharge.   Objective: Vitals:   05/07/20 0500 05/07/20 0537 05/07/20 0807 05/07/20 1157  BP:  (!) 124/58 140/63 135/63  Pulse:  66 69 70  Resp:  16 14   Temp:  98.5 F (36.9 C) 98.1 F (36.7 C) 97.8 F (36.6 C)  TempSrc:  Oral Oral   SpO2:  97% 98% 98%  Weight: 59.3 kg     Height:        Intake/Output Summary (Last 24 hours) at 05/07/2020 1737 Last data filed at 05/07/2020 1600 Gross per 24 hour  Intake 240 ml  Output 1900 ml  Net -1660 ml   Filed Weights   05/04/20 0252 05/05/20 0427 05/07/20 0500  Weight: 59 kg 59.3 kg 59.3 kg    Examination:  Constitutional: NAD, alert, confused, no insight HEENT: conjunctivae and lids normal, EOMI CV: No cyanosis.   RESP: normal respiratory effort, on RA Extremities: No effusions, edema in BLE SKIN: warm, dry and intact Neuro: II - XII grossly intact.     Data Reviewed: I have personally reviewed following labs and imaging studies  CBC: Recent Labs  Lab 05/02/20 1617 05/04/20 0811 05/07/20 0428  WBC 12.4* 7.6 7.4  NEUTROABS 10.4* 6.1 5.0  HGB 13.2 13.7 12.7*  HCT 39.3 39.7 36.6*  MCV 93.1 91.7 91.5  PLT 201 209 366   Basic Metabolic Panel: Recent Labs  Lab 05/02/20 1617 05/04/20 0811 05/07/20 0428  NA 137 138 134*  K 3.7 3.9 4.4  CL 103 107 101  CO2 24 22 24   GLUCOSE 118* 135* 151*  BUN 17 24* 30*  CREATININE 1.06 0.91 0.98  CALCIUM 8.8* 9.0 8.9   GFR: Estimated Creatinine Clearance: 52.9 mL/min (by C-G formula based on SCr of 0.98 mg/dL). Liver Function Tests: No results for input(s): AST, ALT, ALKPHOS, BILITOT, PROT, ALBUMIN in the last 168 hours. No results for input(s): LIPASE, AMYLASE in the last 168 hours. No results for input(s): AMMONIA in the last 168 hours. Coagulation Profile: No results for input(s): INR, PROTIME in the last 168 hours. Cardiac Enzymes: No results for input(s):  CKTOTAL, CKMB, CKMBINDEX, TROPONINI in the last 168 hours. BNP (last 3 results) No results for input(s): PROBNP in the last 8760 hours. HbA1C: No results for input(s): HGBA1C in the last 72 hours. CBG: Recent Labs  Lab 05/06/20 1301 05/06/20 1616 05/07/20 0755 05/07/20 1155 05/07/20 1636  GLUCAP 102* 119* 131* 94 98   Lipid Profile: No results for input(s): CHOL, HDL, LDLCALC, TRIG, CHOLHDL, LDLDIRECT in the last 72 hours. Thyroid Function Tests: No results for input(s): TSH, T4TOTAL, FREET4, T3FREE, THYROIDAB in the last 72 hours. Anemia Panel: No results for input(s): VITAMINB12, FOLATE, FERRITIN, TIBC, IRON, RETICCTPCT in the last 72 hours. Sepsis Labs: No results for input(s): PROCALCITON, LATICACIDVEN in the last 168 hours.  Recent Results (from the past 240 hour(s))  Resp Panel by RT-PCR (Flu A&B, Covid) Nasopharyngeal Swab     Status: None   Collection Time: 05/03/20 12:45 AM   Specimen:  Nasopharyngeal Swab; Nasopharyngeal(NP) swabs in vial transport medium  Result Value Ref Range Status   SARS Coronavirus 2 by RT PCR NEGATIVE NEGATIVE Final    Comment: (NOTE) SARS-CoV-2 target nucleic acids are NOT DETECTED.  The SARS-CoV-2 RNA is generally detectable in upper respiratory specimens during the acute phase of infection. The lowest concentration of SARS-CoV-2 viral copies this assay can detect is 138 copies/mL. A negative result does not preclude SARS-Cov-2 infection and should not be used as the sole basis for treatment or other patient management decisions. A negative result may occur with  improper specimen collection/handling, submission of specimen other than nasopharyngeal swab, presence of viral mutation(s) within the areas targeted by this assay, and inadequate number of viral copies(<138 copies/mL). A negative result must be combined with clinical observations, patient history, and epidemiological information. The expected result is Negative.  Fact Sheet  for Patients:  EntrepreneurPulse.com.au  Fact Sheet for Healthcare Providers:  IncredibleEmployment.be  This test is no t yet approved or cleared by the [place] FDA and  has been authorized for detection and/or diagnosis of SARS-CoV-2 by FDA under an Emergency Use Authorization (EUA). This EUA will remain  in effect (meaning this test can be used) for the duration of the COVID-19 declaration under Section 564(b)(1) of the Act, 21 U.S.C.section 360bbb-3(b)(1), unless the authorization is terminated  or revoked sooner.       Influenza A by PCR NEGATIVE NEGATIVE Final   Influenza B by PCR NEGATIVE NEGATIVE Final    Comment: (NOTE) The Xpert Xpress SARS-CoV-2/FLU/RSV plus assay is intended as an aid in the diagnosis of influenza from Nasopharyngeal swab specimens and should not be used as a sole basis for treatment. Nasal washings and aspirates are unacceptable for Xpert Xpress SARS-CoV-2/FLU/RSV testing.  Fact Sheet for Patients: EntrepreneurPulse.com.au  Fact Sheet for Healthcare Providers: IncredibleEmployment.be  This test is not yet approved or cleared by the [place] FDA and has been authorized for detection and/or diagnosis of SARS-CoV-2 by FDA under an Emergency Use Authorization (EUA). This EUA will remain in effect (meaning this test can be used) for the duration of the COVID-19 declaration under Section 564(b)(1) of the Act, 21 U.S.C. section 360bbb-3(b)(1), unless the authorization is terminated or revoked.  Performed at [facility], [address] [place], [place] [address]   MRSA PCR Screening     Status: None   Collection Time: 05/04/20  6:53 PM   Specimen: Nasopharyngeal  Result Value Ref Range Status   MRSA by PCR NEGATIVE NEGATIVE Final    Comment:        The GeneXpert MRSA Assay (FDA approved for NASAL specimens only), is one component of a comprehensive MRSA  colonization surveillance program. It is not intended to diagnose MRSA infection nor to guide or monitor treatment for MRSA infections. Performed at [facility], [address], [place], [place] [address]          Radiology Studies: No results found.      Scheduled Meds: . amoxicillin-clavulanate  1 tablet Oral Q12H  . Chlorhexidine Gluconate Cloth  6 each Topical Daily  . enoxaparin (LOVENOX) injection  40 mg Subcutaneous Q24H  . insulin aspart  0-5 Units Subcutaneous QHS  . insulin aspart  0-9 Units Subcutaneous TID WC  . melatonin  5 mg Oral QHS  . sodium chloride flush  3 mL Intravenous Q12H  . tamsulosin  0.4 mg Oral QPC supper   Continuous Infusions:    LOS: 4 days  Enzo Bi, MD [facility] Pager [phone]  If 7PM-7AM, please contact night-coverage 05/07/2020, 5:37 PM

## 2020-05-07 NOTE — Plan of Care (Signed)
Continuing with plan of care. 

## 2020-05-07 NOTE — TOC Progression Note (Addendum)
Transition of Care [facility]) - Progression Note    Patient Details  Name: John Shaw MRN: [id number] Date of Birth: 27-Sep-1940  Transition of Care [facility]) CM/SW [place], LCSW Phone Number: 05/07/2020, 10:19 AM  Clinical Narrative:    CSW submitted authorization request via portal, ref # [id number] Waiting for insurance approval  12/19:Authorization approval received  Auth # [id number]   Ref# [id number]       Expected Discharge Plan and Services                                                 Social Determinants of Health (SDOH) Interventions    Readmission Risk Interventions No flowsheet data found.

## 2020-05-08 LAB — BASIC METABOLIC PANEL
Anion gap: 12 (ref 5–15)
BUN: 39 mg/dL — ABNORMAL HIGH (ref 8–23)
CO2: 23 mmol/L (ref 22–32)
Calcium: 9.1 mg/dL (ref 8.9–10.3)
Chloride: 101 mmol/L (ref 98–111)
Creatinine, Ser: 1.16 mg/dL (ref 0.61–1.24)
GFR, Estimated: >60 mL/min (ref >60)
Glucose, Bld: 127 mg/dL — ABNORMAL HIGH (ref 70–99)
Potassium: 4.7 mmol/L (ref 3.5–5.1)
Sodium: 136 mmol/L (ref 135–145)

## 2020-05-08 LAB — RESP PANEL BY RT-PCR (FLU A&B, COVID) ARPGX2
Influenza A by PCR: NEGATIVE
Influenza B by PCR: NEGATIVE
SARS Coronavirus 2 by RT PCR: NEGATIVE

## 2020-05-08 LAB — CBC
HCT: 39.1 % (ref 39.0–52.0)
Hemoglobin: 13.1 g/dL (ref 13.0–17.0)
MCH: 31 pg (ref 26.0–34.0)
MCHC: 33.5 g/dL (ref 30.0–36.0)
MCV: 92.7 fL (ref 80.0–100.0)
Platelets: 266 10*3/uL (ref 150–400)
RBC: 4.22 MIL/uL (ref 4.22–5.81)
RDW: 12.6 % (ref 11.5–15.5)
WBC: 7.5 10*3/uL (ref 4.0–10.5)
nRBC: 0 % (ref 0.0–0.2)

## 2020-05-08 LAB — MAGNESIUM: Magnesium: 2.5 mg/dL — ABNORMAL HIGH (ref 1.7–2.4)

## 2020-05-08 LAB — GLUCOSE, CAPILLARY: Glucose-Capillary: 133 mg/dL — ABNORMAL HIGH (ref 70–99)

## 2020-05-08 MED ORDER — HYDROCODONE-ACETAMINOPHEN 5-325 MG PO TABS: 1.0000 | ORAL_TABLET | Freq: Two times a day (BID) | ORAL | 0 refills | Status: AC | PRN

## 2020-05-08 MED ORDER — TAMSULOSIN HCL 0.4 MG PO CAPS
0.4000 mg | ORAL_CAPSULE | Freq: Every day | ORAL | Status: AC
Start: 2020-05-08 — End: ?

## 2020-05-08 NOTE — Care Management Important Message (Signed)
Important Message  Patient Details  Name: John Shaw MRN: [id number] Date of Birth: [birth year]   Medicare Important Message Given:  Yes     Dannette Barbara 05/08/2020, 11:05 AM

## 2020-05-08 NOTE — Discharge Summary (Signed)
Physician Discharge Summary   John Shaw  male DOB: [birth year]  XTG:[id number]  PCP: Albina Billet, MD  Admit date: 05/02/2020 Discharge date: 05/08/2020  Admitted From: home Disposition:  SNF CODE STATUS: Full code  Discharge Instructions    Discharge wound care:   Complete by: As directed    Cleanse sacral wound with no rinse cleaner, pat dry. Apply Aquacel Advantage Kellie Simmering # [phone]) over the wound and secure with foam dressing. Change daily.       Hospital Course:  For full details, please see H&P, progress notes, consult notes and ancillary notes.  Briefly,  John Shaw is a 79 year old male with history significant for diabetes, hypertension, rectal cancer who presented to the emergency room with chief complaint of left buttock pain for past several days.   Also apparently had a unwitnessed fall at home. CT imaging demonstrated fracture zygomatic arch with evidence of a stable subdural hematoma. Neurosurgery and ENT were consulted from the emergency room. Neither one recommended inpatient intervention and suggested outpatient follow-up post discharge.  Cellulitis of buttock Painful sacral decubitus ulcer, stage III (HCC) CT abdomen and pelvis demonstrating skin and soft tissue thickening overlying the sacrum consistent with decubitus ulcer. There is no drainable fluid collection, tracking air or CT findings of sacral osteomyelitis.  Pt was started on ceftriaxone and then transitioned to Augmentin, and have completed a 7-day course prior to discharge.  Wound care for the sacral ulcer as per wound care specialist as above in the discharge instruction.  Acute urinary retention, resolved Patient was bladder scanned in ED with 500 cc.  Foley catheter was placed presumably without attempted in and out catheterization.  Foley discontinued.  Patient voiding spontaneously.  Flomax started.  Fall at home, initial encounter PT rec SNF rehab.  Multiple  facial bone fracture [facility]) Outpatient followup with ENT.  Subdural hematoma without coma (HCC) CT head x2 6 hours apart with no change.  Neurosurgery was consulted from the ER and no further follow-up recommended.  Hx of Diabetes mellitus without complication ([place]), not currently active A1c 5.1.  No need for diabetic treatment.  Hx of Hypertension Not on home BP meds at home.  BP 120'-150's.  Will not start BP meds during inpatient setting.  Defer to primary care provider for BP management.    Discharge Diagnoses:  Principal Problem:   Cellulitis of buttock Active Problems:   Diabetes mellitus without complication ([place])   Hypertension   Sacral decubitus ulcer, stage III ([place])   Acute urinary retention   Fall at home, initial encounter   Facial bone fracture ([place])   Subdural hematoma without coma [facility])    Discharge Instructions:  Allergies as of 05/08/2020   No Known Allergies     Medication List    STOP taking these medications   Melatonin 1 MG Caps     TAKE these medications   HYDROcodone-acetaminophen 5-325 MG tablet Commonly known as: NORCO/VICODIN Take 1 tablet by mouth 2 (two) times daily as needed for moderate pain.   lidocaine 5 % Commonly known as: Lidoderm Place 1 patch onto the skin every 12 (twelve) hours. Remove & Discard patch within 12 hours or as directed by MD   multivitamin tablet Take 1 tablet by mouth daily.   Osteo Bi-Flex Joint Shield Tabs Take 1 tablet by mouth daily.   tamsulosin 0.4 MG Caps capsule Commonly known as: FLOMAX Take 1 capsule (0.4 mg total) by mouth daily after supper.  Discharge Care Instructions  (From admission, onward)         Start     Ordered   05/08/20 0000  Discharge wound care:       Comments: Cleanse sacral wound with no rinse cleaner, pat dry. Apply Aquacel Advantage Kellie Simmering # [phone]) over the wound and secure with foam dressing. Change daily.   05/08/20 1258           Follow-up  Information    Albina Billet, MD. Schedule an appointment as soon as possible for a visit in 1 week(s).   Specialty: Internal Medicine Contact information: [address]   [place] [address] [phone]               No Known Allergies   The results of significant diagnostics from this hospitalization (including imaging, microbiology, ancillary and laboratory) are listed below for reference.   Consultations:   Procedures/Studies: CT Head Wo Contrast  Result Date: 05/03/2020 CLINICAL DATA:  Subdural hematoma, follow-up examination EXAM: CT HEAD WITHOUT CONTRAST TECHNIQUE: Contiguous axial images were obtained from the base of the skull through the vertex without intravenous contrast. COMPARISON:  05/02/2020 FINDINGS: Brain: Tiny subdural hematoma seen just above the right middle cranial fossa superficial to the frontal operculum is stable. No interval acute intracranial hemorrhage. Mild parenchymal volume loss is again noted, commensurate with the patient's age. Moderate periventricular white matter changes are present likely reflecting the sequela of small vessel ischemia. No abnormal mass effect or midline shift. No abnormal intra or extra-axial mass lesion. No evidence of acute infarct. Ventricular size is normal. Cerebellum is unremarkable. Vascular: No asymmetric hyperdense vasculature noted at the skull base. Skull: Intact. Sinuses/Orbits: Moderate mucosal thickening is noted throughout the ethmoid air cells bilaterally. Bilateral middle turbinectomy has been performed. No air-fluid levels are identified. Remaining paranasal sinuses are clear. Orbits are unremarkable. Other: Remote fracture of the right zygomatic bone noted. Mastoid air cells and middle ear cavities are clear. IMPRESSION: Stable tiny right subdural hematoma. No significant associated mass effect. No interval hemorrhage. Electronically Signed   By: Fidela Salisbury MD   On: 05/03/2020 00:40   CT HEAD WO  CONTRAST  Result Date: 05/02/2020 CLINICAL DATA:  Head trauma, minor. Additional history provided: Fall yesterday, bruise to upper eye/right forehead. EXAM: CT HEAD WITHOUT CONTRAST CT CERVICAL SPINE WITHOUT CONTRAST TECHNIQUE: Multidetector CT imaging of the head and cervical spine was performed following the standard protocol without intravenous contrast. Multiplanar CT image reconstructions of the cervical spine were also generated. COMPARISON:  Prior head CT examinations 10/28/2019 and earlier. FINDINGS: CT HEAD FINDINGS Brain: Mild cerebral atrophy. Acute subdural hematoma overlying the right frontal operculum and right temporal lobe measuring 5 mm in thickness (for instance as seen on series 2, image 19) (series 4, image 25). Minimal mass effect upon the underlying right frontal lobe. No midline shift. Moderately advanced ill-defined hypoattenuation within the cerebral white matter is nonspecific, but compatible with chronic small vessel ischemic disease. No demarcated cortical infarct. No extra-axial fluid collection. No evidence of intracranial mass. Vascular: No hyperdense vessel.  Atherosclerotic calcifications. Skull: Normal. Negative for fracture or focal lesion. Sinuses/Orbits: Visualized orbits show no acute finding. Postsurgical appearance of the ethmoid sinuses with polypoid mucosal thickening. Trace mucosal thickening and small mucous retention cysts within the right maxillary sinus. Other: Displaced fracture of the right zygomatic arch. Mildly displaced fracture of the lateral wall of the right orbit (for instance as seen on series 3, image 24). These  fractures are new as compared to the prior head CT of 10/28/2019. CT CERVICAL SPINE FINDINGS Alignment: Cervical levocurvature. Trace C3-C4 grade 1 anterolisthesis. Skull base and vertebrae: The basion-dental and atlanto-dental intervals are maintained.No evidence of acute fracture to the cervical spine. Soft tissues and spinal canal: No  prevertebral fluid or swelling. No visible canal hematoma. Disc levels: Cervical spondylosis with multilevel disc space narrowing, disc bulges, posterior disc osteophytes, uncovertebral hypertrophy and facet arthrosis. Disc space narrowing is advanced at C4-C5, C5-C6 and C6-C7. A posterior disc osteophyte complex at C6-C7 contributes to at least mild spinal canal stenosis. Upper chest: Right apical pleuroparenchymal scarring. No consolidation within the imaged lung apices. No visible pneumothorax. These results were called by telephone at the time of interpretation on 05/02/2020 at 6:51 pm to provider Conni Slipper , who verbally acknowledged these results. IMPRESSION: CT head: 1. Acute subdural hematoma overlying the right frontal operculum and right temporal lobe measuring 5 mm in thickness. Minimal mass effect upon the underlying right frontal lobe. No midline shift. 2. Displaced fractures of the right zygomatic arch and lateral wall of the right orbit, new from the prior head CT of 10/28/2019. Consider a dedicated maxillofacial CT for further evaluation. 3. Moderately advanced cerebral white matter chronic small vessel ischemic disease. 4. Mild cerebral atrophy. 5. Paranasal sinus disease as described. CT cervical spine: 1. No evidence of acute fracture to the cervical spine. 2. Mild C3-C4 grade 1 anterolisthesis. 3. Cervical levocurvature. 4. Cervical spondylosis as described and greatest at C4-C5, C5-C6 and C6-C7. Electronically Signed   By: Kellie Simmering DO   On: 05/02/2020 18:53   CT Cervical Spine Wo Contrast  Result Date: 05/02/2020 CLINICAL DATA:  Head trauma, minor. Additional history provided: Fall yesterday, bruise to upper eye/right forehead. EXAM: CT HEAD WITHOUT CONTRAST CT CERVICAL SPINE WITHOUT CONTRAST TECHNIQUE: Multidetector CT imaging of the head and cervical spine was performed following the standard protocol without intravenous contrast. Multiplanar CT image reconstructions of the  cervical spine were also generated. COMPARISON:  Prior head CT examinations 10/28/2019 and earlier. FINDINGS: CT HEAD FINDINGS Brain: Mild cerebral atrophy. Acute subdural hematoma overlying the right frontal operculum and right temporal lobe measuring 5 mm in thickness (for instance as seen on series 2, image 19) (series 4, image 25). Minimal mass effect upon the underlying right frontal lobe. No midline shift. Moderately advanced ill-defined hypoattenuation within the cerebral white matter is nonspecific, but compatible with chronic small vessel ischemic disease. No demarcated cortical infarct. No extra-axial fluid collection. No evidence of intracranial mass. Vascular: No hyperdense vessel.  Atherosclerotic calcifications. Skull: Normal. Negative for fracture or focal lesion. Sinuses/Orbits: Visualized orbits show no acute finding. Postsurgical appearance of the ethmoid sinuses with polypoid mucosal thickening. Trace mucosal thickening and small mucous retention cysts within the right maxillary sinus. Other: Displaced fracture of the right zygomatic arch. Mildly displaced fracture of the lateral wall of the right orbit (for instance as seen on series 3, image 24). These fractures are new as compared to the prior head CT of 10/28/2019. CT CERVICAL SPINE FINDINGS Alignment: Cervical levocurvature. Trace C3-C4 grade 1 anterolisthesis. Skull base and vertebrae: The basion-dental and atlanto-dental intervals are maintained.No evidence of acute fracture to the cervical spine. Soft tissues and spinal canal: No prevertebral fluid or swelling. No visible canal hematoma. Disc levels: Cervical spondylosis with multilevel disc space narrowing, disc bulges, posterior disc osteophytes, uncovertebral hypertrophy and facet arthrosis. Disc space narrowing is advanced at C4-C5, C5-C6 and C6-C7. A posterior disc osteophyte  complex at C6-C7 contributes to at least mild spinal canal stenosis. Upper chest: Right apical  pleuroparenchymal scarring. No consolidation within the imaged lung apices. No visible pneumothorax. These results were called by telephone at the time of interpretation on 05/02/2020 at 6:51 pm to provider Conni Slipper , who verbally acknowledged these results. IMPRESSION: CT head: 1. Acute subdural hematoma overlying the right frontal operculum and right temporal lobe measuring 5 mm in thickness. Minimal mass effect upon the underlying right frontal lobe. No midline shift. 2. Displaced fractures of the right zygomatic arch and lateral wall of the right orbit, new from the prior head CT of 10/28/2019. Consider a dedicated maxillofacial CT for further evaluation. 3. Moderately advanced cerebral white matter chronic small vessel ischemic disease. 4. Mild cerebral atrophy. 5. Paranasal sinus disease as described. CT cervical spine: 1. No evidence of acute fracture to the cervical spine. 2. Mild C3-C4 grade 1 anterolisthesis. 3. Cervical levocurvature. 4. Cervical spondylosis as described and greatest at C4-C5, C5-C6 and C6-C7. Electronically Signed   By: Kellie Simmering DO   On: 05/02/2020 18:53   CT ABDOMEN PELVIS W CONTRAST  Result Date: 05/02/2020 CLINICAL DATA:  Abdominal abscess/infection suspected Pain in the low abdomen and reports of pain over the buttocks Fall yesterday.  Known decubitus ulcer. EXAM: CT ABDOMEN AND PELVIS WITH CONTRAST TECHNIQUE: Multidetector CT imaging of the abdomen and pelvis was performed using the standard protocol following bolus administration of intravenous contrast. CONTRAST:  152mL OMNIPAQUE IOHEXOL 300 MG/ML  SOLN COMPARISON:  CT 09/29/2017 FINDINGS: Lower chest: Chronic scarring in the lingula. The minimal subpleural reticulation which appears chronic. No acute airspace disease or pleural effusion. Hepatobiliary: Homogeneous liver attenuation without focal lesion. Innumerable stones fill the gallbladder. There is no pericholecystic edema. There is no biliary dilatation or  visualized choledocholithiasis. Pancreas: No ductal dilatation or inflammation. There is a 13 x 9 mm cyst in the pancreatic body, increased in size from prior exam where it measured 9 x 7 mm. Spleen: Normal in size without focal abnormality. Adrenals/Urinary Tract: Normal adrenal glands. No hydronephrosis or perinephric edema. Homogeneous renal enhancement with symmetric excretion on delayed phase imaging. Small low-density lesions in the upper and lower right kidney are too small to characterize but likely small cysts. There is an indeterminate 13 mm lesion in the posterior mid left kidney, unchanged in size in appearance from prior. Foley catheter in the urinary bladder, despite balloon in the bladder lumen the bladder is distended. There is no perivesicular fat stranding or wall thickening. Stomach/Bowel: Bowel evaluation is limited in the absence of enteric contrast and paucity of intra-abdominal fat. Decompressed stomach. No small bowel obstruction or inflammatory change. The appendix is normal. Diffuse colonic diverticulosis without focal diverticulitis. Enteric sutures noted within the bowel loops in the central abdomen. The sigmoid colon is tortuous. Postoperative changes in the rectosigmoid colon. Collection of air and soft tissue density posterior left rectosigmoid colon with seen on prior exam, series 4, image 22. The air component is diminished, soft tissue component has increased measuring 3.7 cm. There is no definite peripherally enhancing collection. Presacral edema is again seen. Vascular/Lymphatic: Aortic atherosclerosis. No aortic aneurysm. Patent portal vein. No enlarged lymph nodes in the abdomen or pelvis. Reproductive: Prominent prostate gland. Other: Presacral soft tissue thickening also seen on prior exam. There is no ascites. No free air. Musculoskeletal: There is subcutaneous soft tissue thickening overlying the sacrum with associated skin thickening. Ill-defined areas of internal low  density but no peripherally enhancing or  drainable fluid collection. There is no definite subjacent bony destruction. Degenerative change in the lower lumbar spine. No acute fracture of the pelvis or spine. IMPRESSION: 1. Skin and soft tissue thickening overlying the sacrum consistent with decubitus ulcer. There is no drainable fluid collection, tracking air or CT findings of sacral osteomyelitis. 2. Postsurgical change in the rectosigmoid colon. There is a chronic perirectal collection of air that has been present dating back to 2019. The air component is diminished from prior, however there is increased surrounding soft tissue thickening. Etiology is indeterminate. This has been previously biopsied. Presacral soft tissue edema is stable. 3. Distended urinary bladder despite Foley catheter in place, recommend correlation with catheter functioning. 4. 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 Recommend further characterization with pancreatic protocol on elective nonemergent basis. 5. Stable size and appearance of hyperattenuating 13 mm lesion in the left kidney. 6. Cholelithiasis without gallbladder inflammation. Aortic Atherosclerosis (ICD10-I70.0). Electronically Signed   By: Keith Rake M.D.   On: 05/02/2020 18:52   CT Maxillofacial Wo Contrast  Result Date: 05/02/2020 CLINICAL DATA:  Facial trauma EXAM: CT MAXILLOFACIAL WITHOUT CONTRAST TECHNIQUE: Multidetector CT imaging of the maxillofacial structures was performed. Multiplanar CT image reconstructions were also generated. COMPARISON:  Head CT today FINDINGS: Osseous: Fracture through the right zygomatic arch. Mandible is intact. No other facial fracture. Orbits: Fracture through the lateral wall of the right orbit. No other orbital fracture. Sinuses: Mucosal thickening within the maxillary sinuses. No air-fluid levels. Soft tissues: Negative Limited intracranial: See head CT report. IMPRESSION: Fractures through the right lateral orbital  wall and right zygomatic arch. No additional facial or orbital fracture. Chronic sinusitis changes. Electronically Signed   By: Rolm Baptise M.D.   On: 05/02/2020 19:41      Labs: BNP (last 3 results) No results for input(s): BNP in the last 8760 hours. Basic Metabolic Panel: Recent Labs  Lab 05/02/20 1617 05/04/20 0811 05/07/20 0428 05/08/20 0240  NA 137 138 134* 136  K 3.7 3.9 4.4 4.7  CL 103 107 101 101  CO2 24 22 24 23   GLUCOSE 118* 135* 151* 127*  BUN 17 24* 30* 39*  CREATININE 1.06 0.91 0.98 1.16  CALCIUM 8.8* 9.0 8.9 9.1  MG  --   --   --  2.5*   Liver Function Tests: No results for input(s): AST, ALT, ALKPHOS, BILITOT, PROT, ALBUMIN in the last 168 hours. No results for input(s): LIPASE, AMYLASE in the last 168 hours. No results for input(s): AMMONIA in the last 168 hours. CBC: Recent Labs  Lab 05/02/20 1617 05/04/20 0811 05/07/20 0428 05/08/20 0240  WBC 12.4* 7.6 7.4 7.5  NEUTROABS 10.4* 6.1 5.0  --   HGB 13.2 13.7 12.7* 13.1  HCT 39.3 39.7 36.6* 39.1  MCV 93.1 91.7 91.5 92.7  PLT 201 209 237 266   Cardiac Enzymes: No results for input(s): CKTOTAL, CKMB, CKMBINDEX, TROPONINI in the last 168 hours. BNP: Invalid input(s): POCBNP CBG: Recent Labs  Lab 05/06/20 1616 05/07/20 0755 05/07/20 1155 05/07/20 1636 05/08/20 0759  GLUCAP 119* 131* 94 98 133*   D-Dimer No results for input(s): DDIMER in the last 72 hours. Hgb A1c No results for input(s): HGBA1C in the last 72 hours. Lipid Profile No results for input(s): CHOL, HDL, LDLCALC, TRIG, CHOLHDL, LDLDIRECT in the last 72 hours. Thyroid function studies No results for input(s): TSH, T4TOTAL, T3FREE, THYROIDAB in the last 72 hours.  Invalid input(s): FREET3 Anemia work up No results for input(s): VITAMINB12,  FOLATE, FERRITIN, TIBC, IRON, RETICCTPCT in the last 72 hours. Urinalysis    Component Value Date/Time   COLORURINE YELLOW (A) 05/02/2020 1735   APPEARANCEUR CLEAR (A) 05/02/2020 1735    APPEARANCEUR Clear 02/02/2013 1001   LABSPEC 1.018 05/02/2020 1735   LABSPEC 1.010 02/02/2013 1001   PHURINE 6.0 05/02/2020 1735   GLUCOSEU 50 (A) 05/02/2020 1735   GLUCOSEU Negative 02/02/2013 1001   HGBUR SMALL (A) 05/02/2020 1735   BILIRUBINUR NEGATIVE 05/02/2020 1735   BILIRUBINUR Negative 02/02/2013 1001   KETONESUR 5 (A) 05/02/2020 1735   PROTEINUR NEGATIVE 05/02/2020 1735   NITRITE NEGATIVE 05/02/2020 1735   LEUKOCYTESUR NEGATIVE 05/02/2020 1735   LEUKOCYTESUR Negative 02/02/2013 1001   Sepsis Labs Invalid input(s): PROCALCITONIN,  WBC,  LACTICIDVEN Microbiology Recent Results (from the past 240 hour(s))  Resp Panel by RT-PCR (Flu A&B, Covid) Nasopharyngeal Swab     Status: None   Collection Time: 05/03/20 12:45 AM   Specimen: Nasopharyngeal Swab; Nasopharyngeal(NP) swabs in vial transport medium  Result Value Ref Range Status   SARS Coronavirus 2 by RT PCR NEGATIVE NEGATIVE Final    Comment: (NOTE) SARS-CoV-2 target nucleic acids are NOT DETECTED.  The SARS-CoV-2 RNA is generally detectable in upper respiratory specimens during the acute phase of infection. The lowest concentration of SARS-CoV-2 viral copies this assay can detect is 138 copies/mL. A negative result does not preclude SARS-Cov-2 infection and should not be used as the sole basis for treatment or other patient management decisions. A negative result may occur with  improper specimen collection/handling, submission of specimen other than nasopharyngeal swab, presence of viral mutation(s) within the areas targeted by this assay, and inadequate number of viral copies(<138 copies/mL). A negative result must be combined with clinical observations, patient history, and epidemiological information. The expected result is Negative.  Fact Sheet for Patients:  EntrepreneurPulse.com.au  Fact Sheet for Healthcare Providers:  IncredibleEmployment.be  This test is no t yet  approved or cleared by the [place] FDA and  has been authorized for detection and/or diagnosis of SARS-CoV-2 by FDA under an Emergency Use Authorization (EUA). This EUA will remain  in effect (meaning this test can be used) for the duration of the COVID-19 declaration under Section 564(b)(1) of the Act, 21 U.S.C.section 360bbb-3(b)(1), unless the authorization is terminated  or revoked sooner.       Influenza A by PCR NEGATIVE NEGATIVE Final   Influenza B by PCR NEGATIVE NEGATIVE Final    Comment: (NOTE) The Xpert Xpress SARS-CoV-2/FLU/RSV plus assay is intended as an aid in the diagnosis of influenza from Nasopharyngeal swab specimens and should not be used as a sole basis for treatment. Nasal washings and aspirates are unacceptable for Xpert Xpress SARS-CoV-2/FLU/RSV testing.  Fact Sheet for Patients: EntrepreneurPulse.com.au  Fact Sheet for Healthcare Providers: IncredibleEmployment.be  This test is not yet approved or cleared by the [place] FDA and has been authorized for detection and/or diagnosis of SARS-CoV-2 by FDA under an Emergency Use Authorization (EUA). This EUA will remain in effect (meaning this test can be used) for the duration of the COVID-19 declaration under Section 564(b)(1) of the Act, 21 U.S.C. section 360bbb-3(b)(1), unless the authorization is terminated or revoked.  Performed at [facility], [address] [place], [place] [address]   MRSA PCR Screening     Status: None   Collection Time: 05/04/20  6:53 PM   Specimen: Nasopharyngeal  Result Value Ref Range Status   MRSA by PCR NEGATIVE NEGATIVE Final  Comment:        The GeneXpert MRSA Assay (FDA approved for NASAL specimens only), is one component of a comprehensive MRSA colonization surveillance program. It is not intended to diagnose MRSA infection nor to guide or monitor treatment for MRSA infections. Performed at [facility], [address] [place], [place] [address]      Total time spend on discharging this patient, including the last patient exam, discussing the hospital stay, instructions for ongoing care as it relates to all pertinent caregivers, as well as preparing the medical discharge records, prescriptions, and/or referrals as applicable, is 35 minutes.    Enzo Bi, MD  [facility] 05/08/2020, 12:58 PM

## 2020-05-08 NOTE — TOC Transition Note (Signed)
Transition of Care [facility]) - CM/SW Discharge Note   Patient Details  Name: John Shaw MRN: [id number] Date of Birth: Feb 27, 1941  [facility] CM/SW Contact:  Beverly Sessions, RN Phone Number: 05/08/2020, 3:25 PM   Clinical Narrative:     Damaris Schooner with Levada Dy patient's cousin/caregiver.  She is in agreement to discharge to [facility] in [place].  DC info sent in the [facility]  EMS packet printed and EMS transport set for 5pm Repeat covid negative.   Bedside RN to call report and make sure that sign Norco script is sent with patient  Levada Dy updated with patient's room number at [facility]   Final next level of care: [place] Barriers to Discharge: No Barriers Identified   Patient Goals and CMS Choice        Discharge Placement              Patient chooses bed at: [facility] Patient to be transferred to facility by: EMS Name of family member notified: Levada Dy Patient and family notified of of transfer: 05/08/20  Discharge Plan and Services                                     Social Determinants of Health (SDOH) Interventions     Readmission Risk Interventions No flowsheet data found.

## 2020-06-20 DEATH — deceased
